# Patient Record
Sex: Female | Born: 1955 | Race: White | Hispanic: No
Health system: Western US, Academic
[De-identification: ages and names within clinical notes are randomized; demographics above are authoritative.]

## PROBLEM LIST (undated history)

## (undated) HISTORY — PX: PR UNLISTED PROCEDURE BREAST: 19499

## (undated) HISTORY — PX: PR TOTAL ABDOMINAL HYSTERECT W/WO RMVL TUBE OVARY: 58150

---

## 1998-08-16 ENCOUNTER — Other Ambulatory Visit: Admission: RE | Admit: 1998-08-16 | Discharge: 1998-08-16 | Payer: Self-pay | Admitting: *Deleted

## 2000-07-03 ENCOUNTER — Other Ambulatory Visit: Admission: RE | Admit: 2000-07-03 | Discharge: 2000-07-03 | Payer: Self-pay | Admitting: *Deleted

## 2004-03-28 ENCOUNTER — Encounter: Payer: Self-pay | Admitting: Internal Medicine

## 2004-06-21 ENCOUNTER — Encounter: Payer: PRIVATE HEALTH INSURANCE | Admitting: Obstetrics & Gynecology

## 2004-07-09 ENCOUNTER — Other Ambulatory Visit (HOSPITAL_BASED_OUTPATIENT_CLINIC_OR_DEPARTMENT_OTHER): Payer: Self-pay | Admitting: Obstetrics & Gynecology

## 2004-07-09 ENCOUNTER — Ambulatory Visit: Payer: PRIVATE HEALTH INSURANCE

## 2004-07-09 ENCOUNTER — Encounter (HOSPITAL_COMMUNITY): Payer: PRIVATE HEALTH INSURANCE | Admitting: Obstetrics & Gynecology

## 2004-07-23 ENCOUNTER — Encounter: Payer: PRIVATE HEALTH INSURANCE | Admitting: Nurse Practitioner

## 2004-08-26 ENCOUNTER — Encounter: Payer: PRIVATE HEALTH INSURANCE | Admitting: Obstetrics & Gynecology

## 2004-10-02 ENCOUNTER — Ambulatory Visit: Payer: Self-pay | Admitting: Orthopaedic Surgery

## 2005-03-06 ENCOUNTER — Ambulatory Visit: Payer: Self-pay | Admitting: Internal Medicine

## 2005-03-11 ENCOUNTER — Ambulatory Visit: Payer: Self-pay | Admitting: Internal Medicine

## 2005-06-11 ENCOUNTER — Ambulatory Visit: Payer: Self-pay | Admitting: Podiatry

## 2005-06-16 ENCOUNTER — Ambulatory Visit: Payer: Self-pay | Admitting: Unknown Physician Specialty

## 2005-06-16 ENCOUNTER — Other Ambulatory Visit: Payer: Self-pay

## 2005-06-19 ENCOUNTER — Ambulatory Visit: Payer: Self-pay | Admitting: Unknown Physician Specialty

## 2005-06-24 ENCOUNTER — Emergency Department: Payer: Self-pay | Admitting: Emergency Medicine

## 2005-10-10 ENCOUNTER — Ambulatory Visit: Payer: Self-pay

## 2005-10-29 ENCOUNTER — Ambulatory Visit: Payer: Self-pay | Admitting: General Practice

## 2006-02-04 ENCOUNTER — Ambulatory Visit: Payer: Self-pay | Admitting: Gynecologic Oncology

## 2006-02-16 ENCOUNTER — Ambulatory Visit: Payer: Self-pay | Admitting: Unknown Physician Specialty

## 2006-03-04 ENCOUNTER — Ambulatory Visit: Payer: Self-pay | Admitting: Gynecologic Oncology

## 2006-03-09 ENCOUNTER — Other Ambulatory Visit: Payer: Self-pay

## 2006-03-09 ENCOUNTER — Ambulatory Visit: Payer: Self-pay | Admitting: Unknown Physician Specialty

## 2006-03-18 ENCOUNTER — Ambulatory Visit: Payer: Self-pay | Admitting: Unknown Physician Specialty

## 2006-04-15 ENCOUNTER — Ambulatory Visit: Payer: Self-pay | Admitting: Gynecologic Oncology

## 2006-06-19 ENCOUNTER — Ambulatory Visit: Payer: Self-pay | Admitting: General Surgery

## 2007-03-16 ENCOUNTER — Ambulatory Visit: Payer: Self-pay | Admitting: Internal Medicine

## 2007-06-08 ENCOUNTER — Ambulatory Visit: Payer: Self-pay | Admitting: Gynecologic Oncology

## 2007-06-19 ENCOUNTER — Ambulatory Visit: Payer: Self-pay | Admitting: Gynecologic Oncology

## 2007-10-17 ENCOUNTER — Ambulatory Visit: Payer: Self-pay | Admitting: Gynecologic Oncology

## 2007-11-23 ENCOUNTER — Ambulatory Visit: Payer: Self-pay | Admitting: Gynecologic Oncology

## 2008-02-15 ENCOUNTER — Ambulatory Visit: Payer: Self-pay | Admitting: Internal Medicine

## 2008-02-17 ENCOUNTER — Ambulatory Visit: Payer: Self-pay | Admitting: Internal Medicine

## 2008-04-04 ENCOUNTER — Ambulatory Visit: Payer: Self-pay | Admitting: Internal Medicine

## 2008-06-08 ENCOUNTER — Ambulatory Visit: Payer: Self-pay | Admitting: Neurology

## 2008-11-03 ENCOUNTER — Ambulatory Visit: Payer: Self-pay | Admitting: Internal Medicine

## 2009-05-21 ENCOUNTER — Ambulatory Visit: Payer: Self-pay | Admitting: Internal Medicine

## 2009-07-10 ENCOUNTER — Ambulatory Visit: Payer: Self-pay | Admitting: Internal Medicine

## 2009-07-27 ENCOUNTER — Ambulatory Visit: Payer: Self-pay | Admitting: Internal Medicine

## 2010-02-04 ENCOUNTER — Ambulatory Visit (HOSPITAL_BASED_OUTPATIENT_CLINIC_OR_DEPARTMENT_OTHER): Admit: 2010-02-04 | Discharge: 2010-02-04 | Disposition: A | Discharge status: Home / Self Care | Payer: Self-pay

## 2010-03-07 ENCOUNTER — Ambulatory Visit: Payer: No Typology Code available for payment source | Attending: Surgical Oncology | Admitting: Surgical Oncology

## 2010-03-07 ENCOUNTER — Ambulatory Visit (HOSPITAL_BASED_OUTPATIENT_CLINIC_OR_DEPARTMENT_OTHER): Payer: No Typology Code available for payment source

## 2010-03-07 DIAGNOSIS — Z923 Personal history of irradiation: Secondary | ICD-10-CM | POA: Insufficient documentation

## 2010-03-07 DIAGNOSIS — Z1501 Genetic susceptibility to malignant neoplasm of breast: Secondary | ICD-10-CM | POA: Insufficient documentation

## 2010-03-07 DIAGNOSIS — Z853 Personal history of malignant neoplasm of breast: Secondary | ICD-10-CM | POA: Insufficient documentation

## 2010-03-07 DIAGNOSIS — Z803 Family history of malignant neoplasm of breast: Secondary | ICD-10-CM | POA: Insufficient documentation

## 2010-03-07 DIAGNOSIS — Z1502 Genetic susceptibility to malignant neoplasm of ovary: Secondary | ICD-10-CM | POA: Insufficient documentation

## 2010-03-19 ENCOUNTER — Ambulatory Visit
Payer: No Typology Code available for payment source | Attending: Plastic and Reconstructive Surgery | Admitting: Plastic and Reconstructive Surgery

## 2010-03-19 DIAGNOSIS — Z803 Family history of malignant neoplasm of breast: Secondary | ICD-10-CM | POA: Insufficient documentation

## 2010-03-19 DIAGNOSIS — Z853 Personal history of malignant neoplasm of breast: Secondary | ICD-10-CM | POA: Insufficient documentation

## 2010-04-07 ENCOUNTER — Other Ambulatory Visit: Payer: Self-pay

## 2010-04-14 ENCOUNTER — Other Ambulatory Visit: Payer: Self-pay

## 2010-06-19 ENCOUNTER — Other Ambulatory Visit: Payer: Self-pay

## 2010-07-17 ENCOUNTER — Other Ambulatory Visit: Payer: Self-pay

## 2010-07-22 ENCOUNTER — Ambulatory Visit: Payer: Self-pay | Admitting: Internal Medicine

## 2010-08-12 ENCOUNTER — Other Ambulatory Visit: Payer: Self-pay

## 2010-08-13 ENCOUNTER — Other Ambulatory Visit: Payer: Self-pay

## 2010-08-14 ENCOUNTER — Other Ambulatory Visit: Payer: Self-pay

## 2010-08-15 ENCOUNTER — Other Ambulatory Visit: Payer: Self-pay

## 2010-08-16 ENCOUNTER — Other Ambulatory Visit: Payer: Self-pay

## 2010-08-17 ENCOUNTER — Other Ambulatory Visit: Payer: Self-pay

## 2010-08-18 ENCOUNTER — Other Ambulatory Visit: Payer: Self-pay

## 2010-08-19 ENCOUNTER — Other Ambulatory Visit: Payer: Self-pay

## 2010-08-20 ENCOUNTER — Other Ambulatory Visit: Payer: Self-pay

## 2010-08-21 ENCOUNTER — Other Ambulatory Visit: Payer: Self-pay

## 2010-08-22 ENCOUNTER — Other Ambulatory Visit: Payer: Self-pay

## 2010-08-22 ENCOUNTER — Ambulatory Visit (HOSPITAL_BASED_OUTPATIENT_CLINIC_OR_DEPARTMENT_OTHER): Payer: No Typology Code available for payment source

## 2010-08-22 ENCOUNTER — Ambulatory Visit: Payer: No Typology Code available for payment source | Attending: Surgical Oncology | Admitting: Surgical Oncology

## 2010-08-22 DIAGNOSIS — Z803 Family history of malignant neoplasm of breast: Secondary | ICD-10-CM | POA: Insufficient documentation

## 2010-08-22 DIAGNOSIS — Z1502 Genetic susceptibility to malignant neoplasm of ovary: Secondary | ICD-10-CM | POA: Insufficient documentation

## 2010-08-22 DIAGNOSIS — Z853 Personal history of malignant neoplasm of breast: Secondary | ICD-10-CM | POA: Insufficient documentation

## 2010-08-22 DIAGNOSIS — Z4001 Encounter for prophylactic removal of breast: Secondary | ICD-10-CM | POA: Insufficient documentation

## 2010-08-22 DIAGNOSIS — Z1501 Genetic susceptibility to malignant neoplasm of breast: Secondary | ICD-10-CM | POA: Insufficient documentation

## 2010-08-24 ENCOUNTER — Other Ambulatory Visit: Payer: Self-pay

## 2010-08-25 ENCOUNTER — Other Ambulatory Visit: Payer: Self-pay

## 2010-08-27 ENCOUNTER — Ambulatory Visit: Payer: No Typology Code available for payment source | Attending: Plastic and Reconstructive Surgery

## 2010-08-27 ENCOUNTER — Ambulatory Visit (HOSPITAL_BASED_OUTPATIENT_CLINIC_OR_DEPARTMENT_OTHER): Payer: No Typology Code available for payment source

## 2010-08-27 ENCOUNTER — Other Ambulatory Visit: Payer: Self-pay

## 2010-08-27 ENCOUNTER — Other Ambulatory Visit: Payer: Self-pay | Admitting: Plastic and Reconstructive Surgery

## 2010-08-27 DIAGNOSIS — Z923 Personal history of irradiation: Secondary | ICD-10-CM | POA: Insufficient documentation

## 2010-08-27 DIAGNOSIS — C50919 Malignant neoplasm of unspecified site of unspecified female breast: Secondary | ICD-10-CM | POA: Insufficient documentation

## 2010-08-27 DIAGNOSIS — Z9221 Personal history of antineoplastic chemotherapy: Secondary | ICD-10-CM | POA: Insufficient documentation

## 2010-08-27 DIAGNOSIS — Z9010 Acquired absence of unspecified breast and nipple: Secondary | ICD-10-CM | POA: Insufficient documentation

## 2010-08-27 DIAGNOSIS — Z90710 Acquired absence of both cervix and uterus: Secondary | ICD-10-CM | POA: Insufficient documentation

## 2010-08-27 DIAGNOSIS — Z01818 Encounter for other preprocedural examination: Secondary | ICD-10-CM | POA: Insufficient documentation

## 2010-08-27 DIAGNOSIS — J984 Other disorders of lung: Secondary | ICD-10-CM | POA: Insufficient documentation

## 2010-08-27 DIAGNOSIS — Z853 Personal history of malignant neoplasm of breast: Secondary | ICD-10-CM | POA: Insufficient documentation

## 2010-08-27 DIAGNOSIS — Z1501 Genetic susceptibility to malignant neoplasm of breast: Secondary | ICD-10-CM | POA: Insufficient documentation

## 2010-08-28 ENCOUNTER — Other Ambulatory Visit: Payer: Self-pay

## 2010-08-29 ENCOUNTER — Other Ambulatory Visit: Payer: Self-pay

## 2010-08-29 LAB — PR CTA ANGIO ABD AORTA COMBO

## 2010-08-30 ENCOUNTER — Other Ambulatory Visit: Payer: Self-pay

## 2010-08-31 ENCOUNTER — Other Ambulatory Visit: Payer: Self-pay

## 2010-09-01 ENCOUNTER — Other Ambulatory Visit: Payer: Self-pay

## 2010-09-02 ENCOUNTER — Other Ambulatory Visit: Payer: Self-pay

## 2010-09-03 ENCOUNTER — Other Ambulatory Visit: Payer: Self-pay

## 2010-09-04 ENCOUNTER — Other Ambulatory Visit: Payer: Self-pay

## 2010-09-04 ENCOUNTER — Encounter (HOSPITAL_BASED_OUTPATIENT_CLINIC_OR_DEPARTMENT_OTHER): Payer: No Typology Code available for payment source

## 2010-09-05 ENCOUNTER — Other Ambulatory Visit: Payer: Self-pay

## 2010-09-06 ENCOUNTER — Other Ambulatory Visit: Payer: Self-pay

## 2010-09-07 ENCOUNTER — Other Ambulatory Visit: Payer: Self-pay

## 2010-09-11 ENCOUNTER — Inpatient Hospital Stay (HOSPITAL_COMMUNITY): Payer: No Typology Code available for payment source | Admitting: Plastic and Reconstructive Surgery

## 2010-09-11 ENCOUNTER — Inpatient Hospital Stay
Admission: RE | Admit: 2010-09-11 | Discharge: 2010-09-16 | DRG: 261 | Disposition: A | Discharge status: Home / Self Care | Payer: No Typology Code available for payment source | Attending: Plastic and Reconstructive Surgery | Admitting: Plastic and Reconstructive Surgery

## 2010-09-11 ENCOUNTER — Other Ambulatory Visit (HOSPITAL_BASED_OUTPATIENT_CLINIC_OR_DEPARTMENT_OTHER): Payer: Self-pay | Admitting: Surgical Oncology

## 2010-09-11 DIAGNOSIS — Z853 Personal history of malignant neoplasm of breast: Secondary | ICD-10-CM

## 2010-09-11 DIAGNOSIS — Z803 Family history of malignant neoplasm of breast: Secondary | ICD-10-CM

## 2010-09-11 DIAGNOSIS — Z4001 Encounter for prophylactic removal of breast: Principal | ICD-10-CM

## 2010-09-11 DIAGNOSIS — D62 Acute posthemorrhagic anemia: Secondary | ICD-10-CM | POA: Diagnosis not present

## 2010-09-11 DIAGNOSIS — Z1501 Genetic susceptibility to malignant neoplasm of breast: Secondary | ICD-10-CM

## 2010-09-11 DIAGNOSIS — Z923 Personal history of irradiation: Secondary | ICD-10-CM

## 2010-09-19 ENCOUNTER — Ambulatory Visit: Payer: No Typology Code available for payment source | Attending: Surgical Oncology | Admitting: Surgical Oncology

## 2010-09-19 DIAGNOSIS — Z853 Personal history of malignant neoplasm of breast: Secondary | ICD-10-CM | POA: Insufficient documentation

## 2010-09-19 DIAGNOSIS — Z09 Encounter for follow-up examination after completed treatment for conditions other than malignant neoplasm: Secondary | ICD-10-CM | POA: Insufficient documentation

## 2010-09-19 DIAGNOSIS — K5900 Constipation, unspecified: Secondary | ICD-10-CM | POA: Insufficient documentation

## 2010-09-19 DIAGNOSIS — Z1501 Genetic susceptibility to malignant neoplasm of breast: Secondary | ICD-10-CM | POA: Insufficient documentation

## 2010-09-19 DIAGNOSIS — R110 Nausea: Secondary | ICD-10-CM | POA: Insufficient documentation

## 2010-09-19 DIAGNOSIS — Z803 Family history of malignant neoplasm of breast: Secondary | ICD-10-CM | POA: Insufficient documentation

## 2010-09-24 ENCOUNTER — Ambulatory Visit
Payer: No Typology Code available for payment source | Attending: Plastic and Reconstructive Surgery | Admitting: Plastic and Reconstructive Surgery

## 2010-09-24 DIAGNOSIS — Z421 Encounter for breast reconstruction following mastectomy: Secondary | ICD-10-CM | POA: Insufficient documentation

## 2010-09-24 DIAGNOSIS — Z853 Personal history of malignant neoplasm of breast: Secondary | ICD-10-CM

## 2010-09-24 DIAGNOSIS — Z9010 Acquired absence of unspecified breast and nipple: Secondary | ICD-10-CM

## 2010-09-24 DIAGNOSIS — Z978 Presence of other specified devices: Secondary | ICD-10-CM | POA: Insufficient documentation

## 2010-09-26 ENCOUNTER — Ambulatory Visit: Payer: No Typology Code available for payment source | Attending: Plastic and Reconstructive Surgery

## 2010-09-26 DIAGNOSIS — C50919 Malignant neoplasm of unspecified site of unspecified female breast: Secondary | ICD-10-CM | POA: Insufficient documentation

## 2010-10-02 ENCOUNTER — Ambulatory Visit: Payer: No Typology Code available for payment source | Attending: Plastic and Reconstructive Surgery

## 2010-10-02 DIAGNOSIS — Z09 Encounter for follow-up examination after completed treatment for conditions other than malignant neoplasm: Secondary | ICD-10-CM | POA: Insufficient documentation

## 2010-10-03 ENCOUNTER — Ambulatory Visit (HOSPITAL_BASED_OUTPATIENT_CLINIC_OR_DEPARTMENT_OTHER): Payer: No Typology Code available for payment source | Admitting: Plastic and Reconstructive Surgery

## 2010-10-03 ENCOUNTER — Other Ambulatory Visit (HOSPITAL_BASED_OUTPATIENT_CLINIC_OR_DEPARTMENT_OTHER): Payer: Self-pay | Admitting: Plastic and Reconstructive Surgery

## 2010-10-03 ENCOUNTER — Ambulatory Visit
Admission: RE | Admit: 2010-10-03 | Discharge: 2010-10-03 | Disposition: A | Discharge status: Home / Self Care | Payer: No Typology Code available for payment source | Attending: Plastic and Reconstructive Surgery | Admitting: Plastic and Reconstructive Surgery

## 2010-10-03 DIAGNOSIS — IMO0002 Reserved for concepts with insufficient information to code with codable children: Secondary | ICD-10-CM | POA: Insufficient documentation

## 2010-10-03 DIAGNOSIS — T85898A Other specified complication of other internal prosthetic devices, implants and grafts, initial encounter: Secondary | ICD-10-CM | POA: Insufficient documentation

## 2010-10-03 DIAGNOSIS — Z9010 Acquired absence of unspecified breast and nipple: Secondary | ICD-10-CM | POA: Insufficient documentation

## 2010-10-03 DIAGNOSIS — T85698A Other mechanical complication of other specified internal prosthetic devices, implants and grafts, initial encounter: Secondary | ICD-10-CM | POA: Insufficient documentation

## 2010-10-03 DIAGNOSIS — Z853 Personal history of malignant neoplasm of breast: Secondary | ICD-10-CM | POA: Insufficient documentation

## 2010-10-03 DIAGNOSIS — N6489 Other specified disorders of breast: Secondary | ICD-10-CM | POA: Insufficient documentation

## 2010-10-09 LAB — PATHOLOGY, SURGICAL

## 2010-10-11 ENCOUNTER — Other Ambulatory Visit (HOSPITAL_BASED_OUTPATIENT_CLINIC_OR_DEPARTMENT_OTHER): Payer: Self-pay | Admitting: Physician Assistant

## 2010-10-11 ENCOUNTER — Ambulatory Visit: Payer: No Typology Code available for payment source | Attending: Physician Assistant

## 2010-10-11 DIAGNOSIS — T85898A Other specified complication of other internal prosthetic devices, implants and grafts, initial encounter: Secondary | ICD-10-CM | POA: Insufficient documentation

## 2010-10-15 LAB — WOUND C/S W/GRAM (ANAEROBIC): Gram Smear: NONE SEEN

## 2010-10-18 ENCOUNTER — Ambulatory Visit: Payer: No Typology Code available for payment source | Attending: Physician Assistant

## 2010-10-18 DIAGNOSIS — B965 Pseudomonas (aeruginosa) (mallei) (pseudomallei) as the cause of diseases classified elsewhere: Secondary | ICD-10-CM | POA: Insufficient documentation

## 2010-10-18 DIAGNOSIS — Z9010 Acquired absence of unspecified breast and nipple: Secondary | ICD-10-CM | POA: Insufficient documentation

## 2010-10-18 DIAGNOSIS — T8140XA Infection following a procedure, unspecified, initial encounter: Secondary | ICD-10-CM | POA: Insufficient documentation

## 2010-10-22 ENCOUNTER — Encounter (HOSPITAL_BASED_OUTPATIENT_CLINIC_OR_DEPARTMENT_OTHER): Payer: No Typology Code available for payment source | Admitting: Plastic and Reconstructive Surgery

## 2010-11-12 ENCOUNTER — Encounter (HOSPITAL_BASED_OUTPATIENT_CLINIC_OR_DEPARTMENT_OTHER): Payer: No Typology Code available for payment source | Admitting: Plastic and Reconstructive Surgery

## 2010-11-12 ENCOUNTER — Ambulatory Visit
Payer: No Typology Code available for payment source | Attending: Plastic and Reconstructive Surgery | Admitting: Plastic and Reconstructive Surgery

## 2010-11-12 DIAGNOSIS — Z853 Personal history of malignant neoplasm of breast: Secondary | ICD-10-CM | POA: Insufficient documentation

## 2010-11-12 DIAGNOSIS — Z9010 Acquired absence of unspecified breast and nipple: Secondary | ICD-10-CM | POA: Insufficient documentation

## 2010-11-12 DIAGNOSIS — T8131XA Disruption of external operation (surgical) wound, not elsewhere classified, initial encounter: Secondary | ICD-10-CM | POA: Insufficient documentation

## 2010-12-24 ENCOUNTER — Encounter (HOSPITAL_BASED_OUTPATIENT_CLINIC_OR_DEPARTMENT_OTHER): Payer: No Typology Code available for payment source | Admitting: Plastic and Reconstructive Surgery

## 2011-05-29 ENCOUNTER — Ambulatory Visit: Payer: No Typology Code available for payment source | Attending: Surgical Oncology | Admitting: Surgical Oncology

## 2011-05-29 DIAGNOSIS — Z853 Personal history of malignant neoplasm of breast: Secondary | ICD-10-CM | POA: Insufficient documentation

## 2011-05-29 DIAGNOSIS — Z803 Family history of malignant neoplasm of breast: Secondary | ICD-10-CM | POA: Insufficient documentation

## 2011-05-29 DIAGNOSIS — Z1501 Genetic susceptibility to malignant neoplasm of breast: Secondary | ICD-10-CM | POA: Insufficient documentation

## 2011-05-29 DIAGNOSIS — B358 Other dermatophytoses: Secondary | ICD-10-CM | POA: Insufficient documentation

## 2011-07-24 ENCOUNTER — Ambulatory Visit: Payer: Self-pay | Admitting: Gynecologic Oncology

## 2011-07-29 ENCOUNTER — Ambulatory Visit: Payer: Self-pay | Admitting: Internal Medicine

## 2011-08-26 ENCOUNTER — Ambulatory Visit: Payer: Self-pay | Admitting: Gynecologic Oncology

## 2011-10-08 ENCOUNTER — Ambulatory Visit: Payer: Self-pay | Admitting: Neurology

## 2011-11-25 ENCOUNTER — Ambulatory Visit
Payer: No Typology Code available for payment source | Attending: Plastic and Reconstructive Surgery | Admitting: Plastic and Reconstructive Surgery

## 2011-11-25 DIAGNOSIS — Z853 Personal history of malignant neoplasm of breast: Secondary | ICD-10-CM | POA: Insufficient documentation

## 2011-11-25 DIAGNOSIS — Z9010 Acquired absence of unspecified breast and nipple: Secondary | ICD-10-CM | POA: Insufficient documentation

## 2012-02-06 ENCOUNTER — Ambulatory Visit: Payer: No Typology Code available for payment source | Attending: Physician Assistant

## 2012-02-06 DIAGNOSIS — Z853 Personal history of malignant neoplasm of breast: Secondary | ICD-10-CM | POA: Insufficient documentation

## 2012-02-06 DIAGNOSIS — Z9010 Acquired absence of unspecified breast and nipple: Secondary | ICD-10-CM | POA: Insufficient documentation

## 2012-02-20 ENCOUNTER — Ambulatory Visit: Payer: No Typology Code available for payment source | Attending: Plastic and Reconstructive Surgery

## 2012-02-20 ENCOUNTER — Other Ambulatory Visit (HOSPITAL_BASED_OUTPATIENT_CLINIC_OR_DEPARTMENT_OTHER): Payer: Self-pay | Admitting: Plastic and Reconstructive Surgery

## 2012-02-20 DIAGNOSIS — N651 Disproportion of reconstructed breast: Secondary | ICD-10-CM | POA: Insufficient documentation

## 2012-02-20 DIAGNOSIS — L988 Other specified disorders of the skin and subcutaneous tissue: Secondary | ICD-10-CM | POA: Insufficient documentation

## 2012-02-20 DIAGNOSIS — Z1501 Genetic susceptibility to malignant neoplasm of breast: Secondary | ICD-10-CM | POA: Insufficient documentation

## 2012-02-20 DIAGNOSIS — N650 Deformity of reconstructed breast: Secondary | ICD-10-CM | POA: Insufficient documentation

## 2012-02-20 DIAGNOSIS — Z853 Personal history of malignant neoplasm of breast: Secondary | ICD-10-CM | POA: Insufficient documentation

## 2012-02-20 DIAGNOSIS — Z6834 Body mass index (BMI) 34.0-34.9, adult: Secondary | ICD-10-CM | POA: Insufficient documentation

## 2012-02-20 DIAGNOSIS — E669 Obesity, unspecified: Secondary | ICD-10-CM | POA: Insufficient documentation

## 2012-02-25 LAB — PATHOLOGY, SURGICAL

## 2012-03-02 ENCOUNTER — Ambulatory Visit
Payer: No Typology Code available for payment source | Attending: Plastic and Reconstructive Surgery | Admitting: Plastic and Reconstructive Surgery

## 2012-03-02 DIAGNOSIS — Z853 Personal history of malignant neoplasm of breast: Secondary | ICD-10-CM | POA: Insufficient documentation

## 2012-03-02 DIAGNOSIS — Z9010 Acquired absence of unspecified breast and nipple: Secondary | ICD-10-CM | POA: Insufficient documentation

## 2012-10-21 ENCOUNTER — Ambulatory Visit: Payer: Self-pay | Admitting: Obstetrics and Gynecology

## 2012-10-21 LAB — BASIC METABOLIC PANEL
Anion Gap: 5 — ABNORMAL LOW (ref 7–16)
BUN: 8 mg/dL (ref 7–18)
Calcium, Total: 8.1 mg/dL — ABNORMAL LOW (ref 8.5–10.1)
Co2: 25 mmol/L (ref 21–32)
EGFR (African American): >60
EGFR (Non-African Amer.): >60
Osmolality: 275 (ref 275–301)
Potassium: 4 mmol/L (ref 3.5–5.1)
Sodium: 139 mmol/L (ref 136–145)

## 2012-10-21 LAB — CBC
HGB: 10.5 g/dL — ABNORMAL LOW (ref 12.0–16.0)
MCH: 26.3 pg (ref 26.0–34.0)
MCV: 83 fL (ref 80–100)
RBC: 3.98 10*6/uL (ref 3.80–5.20)
RDW: 18.6 % — ABNORMAL HIGH (ref 11.5–14.5)
WBC: 8.6 10*3/uL (ref 3.6–11.0)

## 2012-10-25 ENCOUNTER — Ambulatory Visit: Payer: Self-pay | Admitting: Obstetrics and Gynecology

## 2013-01-06 ENCOUNTER — Other Ambulatory Visit: Payer: Self-pay

## 2013-01-28 ENCOUNTER — Emergency Department: Payer: Self-pay | Admitting: Emergency Medicine

## 2013-01-28 LAB — COMPREHENSIVE METABOLIC PANEL
Alkaline Phosphatase: 122 U/L (ref 50–136)
Anion Gap: 10 (ref 7–16)
Bilirubin,Total: 0.2 mg/dL (ref 0.2–1.0)
Calcium, Total: 8.5 mg/dL (ref 8.5–10.1)
Chloride: 109 mmol/L — ABNORMAL HIGH (ref 98–107)
Creatinine: 1.12 mg/dL (ref 0.60–1.30)
Glucose: 82 mg/dL (ref 65–99)
Osmolality: 280 (ref 275–301)
Potassium: 4 mmol/L (ref 3.5–5.1)
SGOT(AST): 23 U/L (ref 15–37)
SGPT (ALT): 26 U/L (ref 12–78)
Total Protein: 7.5 g/dL (ref 6.4–8.2)

## 2013-01-28 LAB — URINALYSIS, COMPLETE
Bilirubin,UR: NEGATIVE
Glucose,UR: NEGATIVE mg/dL (ref 0–75)
Ketone: NEGATIVE
Nitrite: NEGATIVE
Ph: 5 (ref 4.5–8.0)
Protein: NEGATIVE
RBC,UR: <1 /HPF (ref 0–5)
Specific Gravity: 1.028 (ref 1.003–1.030)
Squamous Epithelial: 2

## 2013-01-28 LAB — CBC
MCV: 78 fL — ABNORMAL LOW (ref 80–100)
Platelet: 543 10*3/uL — ABNORMAL HIGH (ref 150–440)
RBC: 4.47 10*6/uL (ref 3.80–5.20)
WBC: 8.4 10*3/uL (ref 3.6–11.0)

## 2013-01-28 LAB — LIPASE, BLOOD: Lipase: 167 U/L (ref 73–393)

## 2013-03-29 ENCOUNTER — Ambulatory Visit: Payer: Self-pay | Admitting: Internal Medicine

## 2014-12-08 NOTE — Op Note (Signed)
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  OPERATIVE PROCEDURE: Wide local excision of the left labia majora.   SURGEON: Herold HarmsMartin A Revia Nghiem, MD.   FIRST ASSISTANT:  Kipp BroodJaime Seal, [employer] PA student.   ANESTHESIA: General.   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 A 6 x 3 cm verrucous lesion was seen preoperatively and is to be excised.   DESCRIPTION OF PROCEDURE: The patient was brought to the operating room where she was placed in the supine position. General anesthesia was induced without difficulty. She was placed in the dorsal lithotomy position using candy cane stirrups. The patient was prepped in the standard fashion. Acetic acid was placed onto the vulva in order to help outline a potential VIN lesions. After soaking the vulva for several minutes, the acetic acid soaked 4 x 4'Koch were removed. There was no obvious acetowhite epithelium seen. There the left labia majora verrucous type epithelium noted that is persisting. This area was outlined with a marking pen. Using Bovie cautery the specimen was then excised using the needle tip point. Once the specimen was resected with dimensions being 6 cm x 3 cm, hemostasis was obtained with point-tip Bovie cautery. The epithelial edges were then reapproximated using simple interrupted sutures of 3-0 Vicryl. Good hemostasis was obtained. The surgical site was then cleaned and dried. The patient was then awakened, mobilized, and taken to the recovery room in satisfactory condition.   ESTIMATED BLOOD LOSS: Minimal.   COMPLICATIONS:  None.    All instruments, needle and sponge counts were verified as correct.   ____________________________ Prentice DockerMartin A.  Courtenay Creger, MD mad:ct D: 10/25/2012 17:15:16 ET T: 10/26/2012 09:15:54 ET JOB#: [id number]  cc: Daphine DeutscherMartin A. Tyshawna Alarid, MD, <Dictator> [facility] Prentice DockerMARTIN A Shaletha Humble MD ELECTRONICALLY SIGNED 10/31/2012 17:39

## 2014-12-22 ENCOUNTER — Ambulatory Visit
Payer: No Typology Code available for payment source | Attending: Plastic and Reconstructive Surgery | Admitting: Plastic and Reconstructive Surgery

## 2014-12-22 ENCOUNTER — Encounter (HOSPITAL_BASED_OUTPATIENT_CLINIC_OR_DEPARTMENT_OTHER): Payer: Self-pay | Admitting: Plastic and Reconstructive Surgery

## 2014-12-22 VITALS — BP 127/85 | HR 79 | Temp 97.2°F | Ht 63.0 in | Wt 189.4 lb

## 2014-12-22 DIAGNOSIS — Z9010 Acquired absence of unspecified breast and nipple: Secondary | ICD-10-CM | POA: Insufficient documentation

## 2014-12-22 DIAGNOSIS — Z9013 Acquired absence of bilateral breasts and nipples: Secondary | ICD-10-CM | POA: Insufficient documentation

## 2014-12-22 DIAGNOSIS — Z853 Personal history of malignant neoplasm of breast: Secondary | ICD-10-CM | POA: Insufficient documentation

## 2014-12-22 NOTE — Patient Instructions (Signed)
Thank you for your visit today to the [facility].     To contact clinic:    To speak with the schedulers or clinic staff (MA/ RN), phone [phone] and select option 8 after the initial automated message. If you have a question requiring medical advice, the front desk staff will take your message and a Plastic Surgery RN or MA will return your call.  Phone calls will be returned based on urgency, generally within 24 hours for all calls.   For urgent concerns after-hours, phone [phone] to speak to the Plastic Surgery Resident on Call.

## 2014-12-22 NOTE — Progress Notes (Signed)
PLASTIC SURGERY FOLLOW-UP NOTE    Reason for visit: Follow-up  Amber Fletcher is a 59 year old patient post B DIEP breast reconstruction after mastectomy    Interval History:   She is doing well. Notes a blue suture protruding from the R breast recon    Physical Exam:  BP 127/85 mmHg  Pulse 79  Temp(Src) 97.2 F (36.2 C) (Temporal)  Ht 5\' 3"  (1.6 m)  Wt 189 lb 6.4 oz (85.911 kg)  BMI 33.56 kg/m2  SpO2 98%  Incision c/d/i  No signs of infection  Blue suture protruding from the R breast, trimmed and extracted, using 18 gauge needle. Flaps healthy and viable. Good balance of shape and volume. Abdominal donor site doing well.       Assessment / Plan  Doing well   No complaints once the blue suture was removed. Very pleased with the long-term outcome of her B DIEP. Good to see her again.       Onica Davidovich K. Michela Pitcher, MD [facility]   Attending Physician, Associate Professor, Division of Plastic Surgery   Director, Microvascular Reconstructive Surgery Fellowship   [facility] [place]   [place], [place], [place], saw and examined the patient and formulated the management plan. I agree with the accompanying note.  Briefly, we had an extensive discussion regarding the surgical options as detailed in the assessment and plan of the accompanying note.  I spent more than 25 minutes of face-to-face time with the patient, over half of which was patient education and counseling.    Kenyonna Micek K. Michela Pitcher, MD

## 2015-01-06 IMAGING — CR DG CHEST 2V
1 series · 2 of 2 positions shown · non-contrast
Comparison: none

REASON FOR EXAM: htn
COMMENTS:

PROCEDURE:     DXR - DXR CHEST PA (OR AP) AND LATERAL  - October 21, 2012  [DATE]
RESULT:     The lungs are clear. The heart and pulmonary vessels are normal.
The bony and mediastinal structures are unremarkable. There is no effusion.
There is no pneumothorax or evidence of congestive failure.

[Series 1: w chest pa · 0.14mm/px · 2 of 2 slices shown]
[im 1/2]
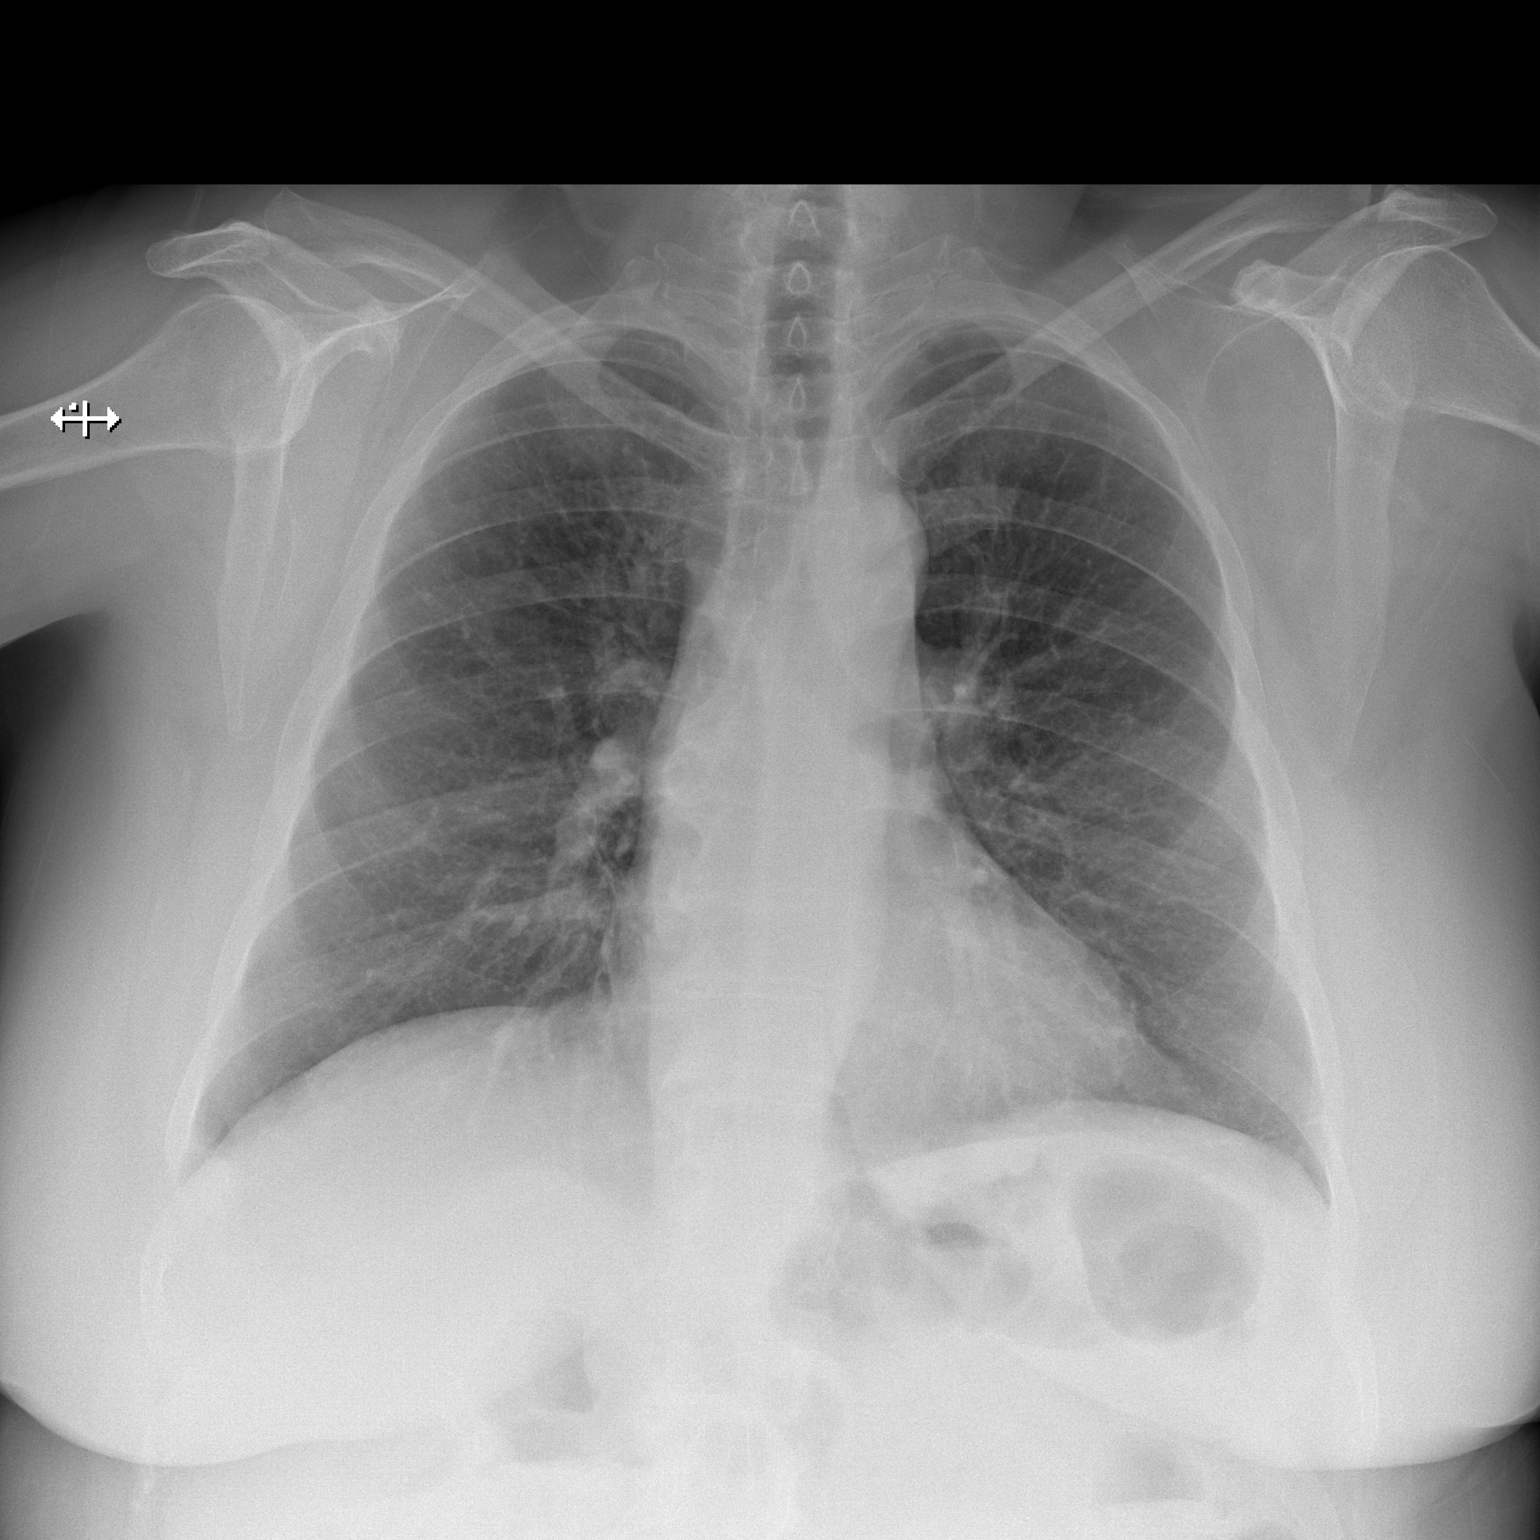
[im 2/2]
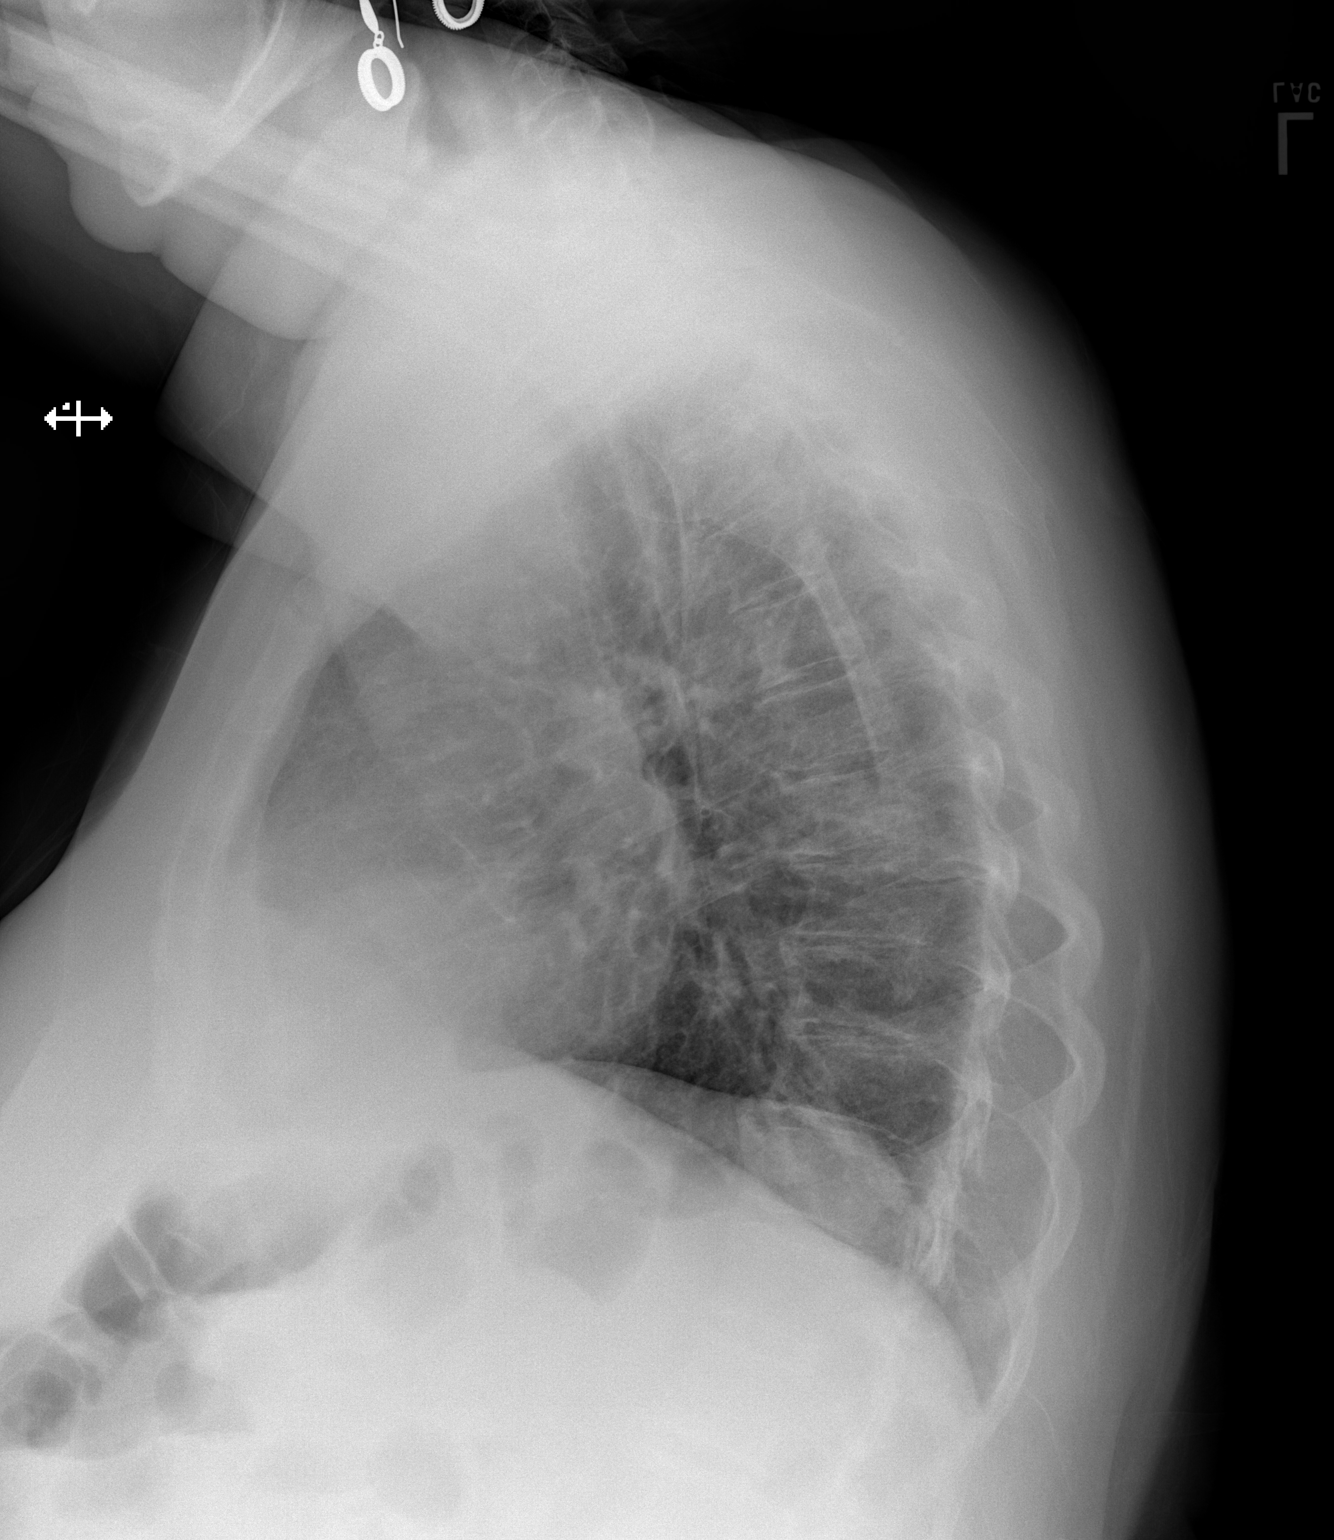

[2 of 2 positions shown; findings below may reference images not displayed]

IMPRESSION: No acute cardiopulmonary disease.

[REDACTED]

## 2015-04-15 IMAGING — CT CT HEAD WITHOUT CONTRAST
1 series · 16 of 30 positions shown, 20 images · non-contrast
Comparison: none

REASON FOR EXAM: pain
COMMENTS:

PROCEDURE:     CT  - CT HEAD WITHOUT CONTRAST  - January 28, 2013  [DATE]
RESULT:     Comparison:  None
TECHNIQUE: Multiple axial images from the foramen magnum to the vertex were
obtained without IV contrast.

[Series 2: soft tissue · axial · 0.46mm/px · z∈[-124,+10]mm · 16 of 30 slices shown, 20 images]
[im 2/30  brain]
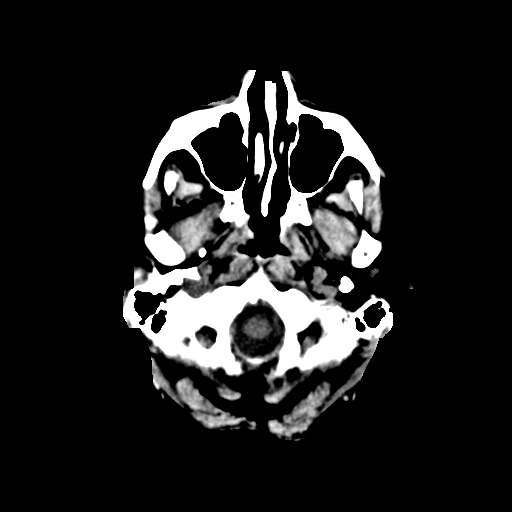
[im 2/30  bone]
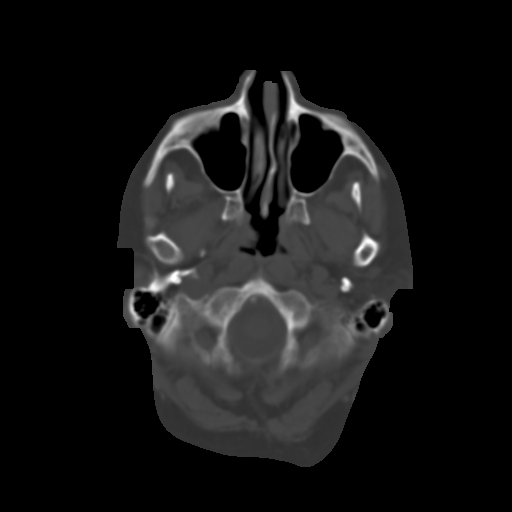
[im 4/30  brain]
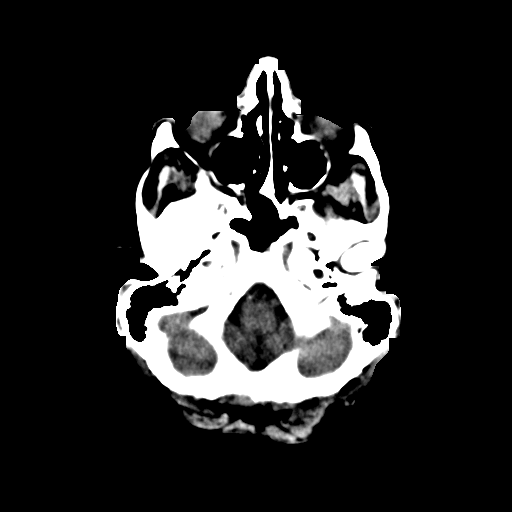
[im 6/30  brain]
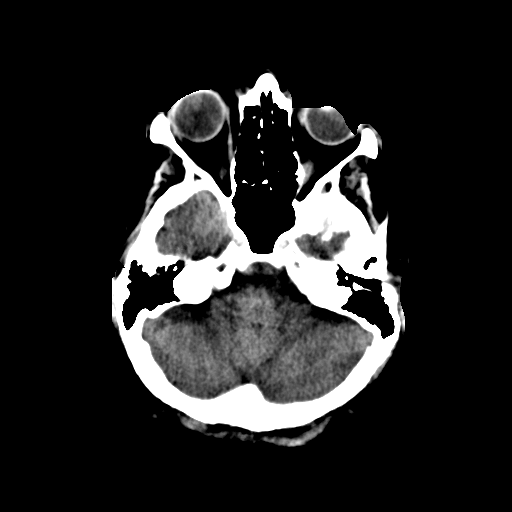
[im 8/30  brain]
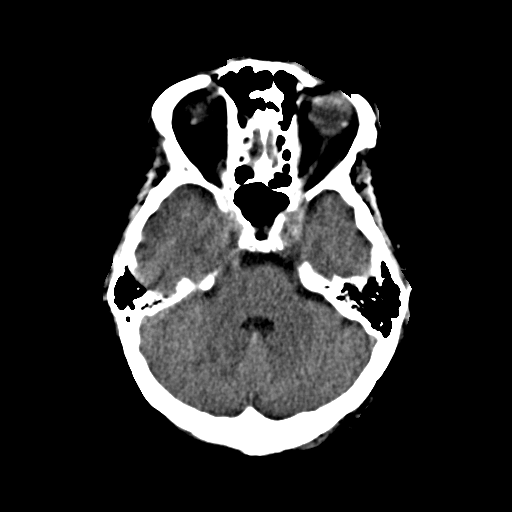
[im 9/30  brain]
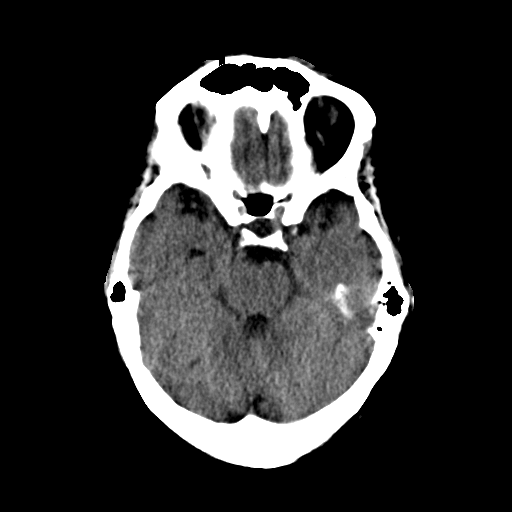
[im 9/30  bone]
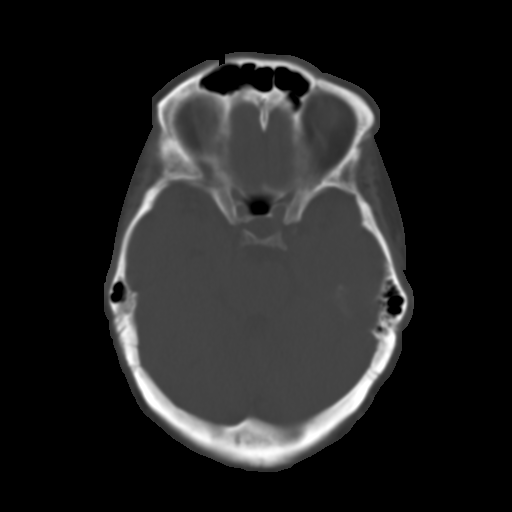
[im 11/30  brain]
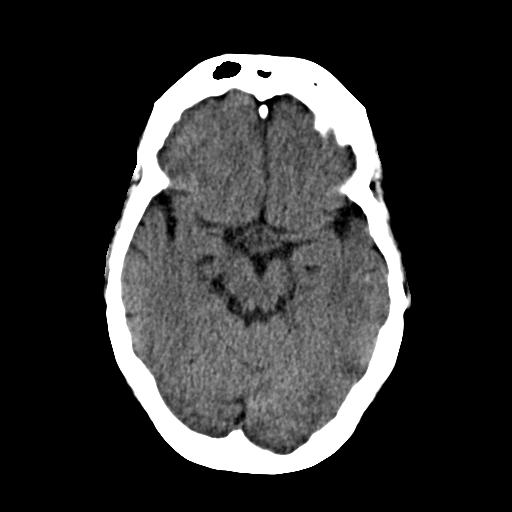
[im 13/30  brain]
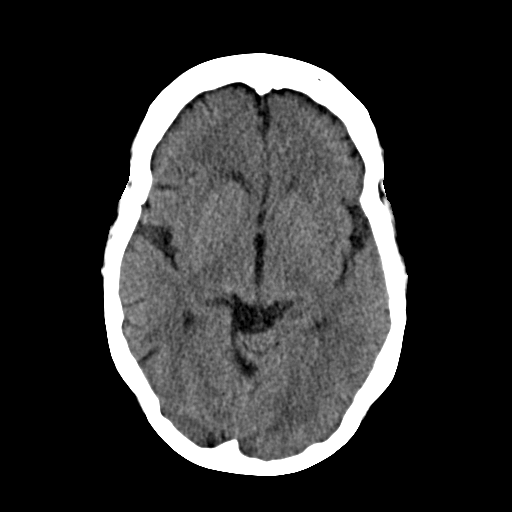
[im 15/30  brain]
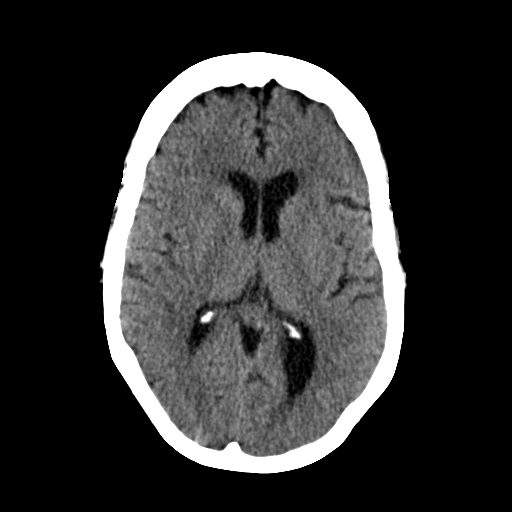
[im 16/30  brain]
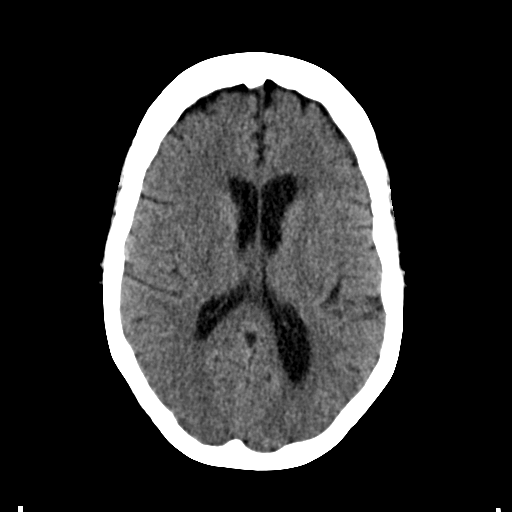
[im 16/30  bone]
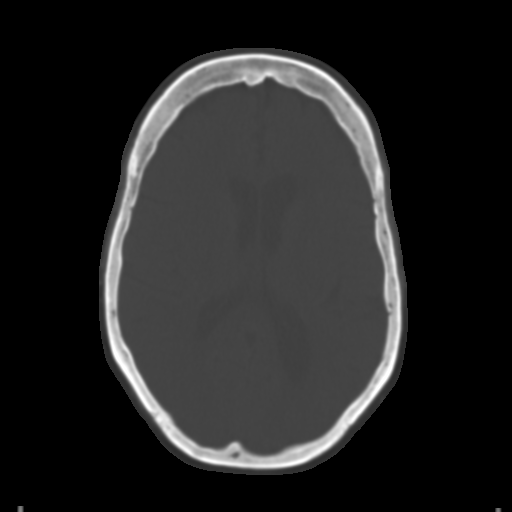
[im 18/30  brain]
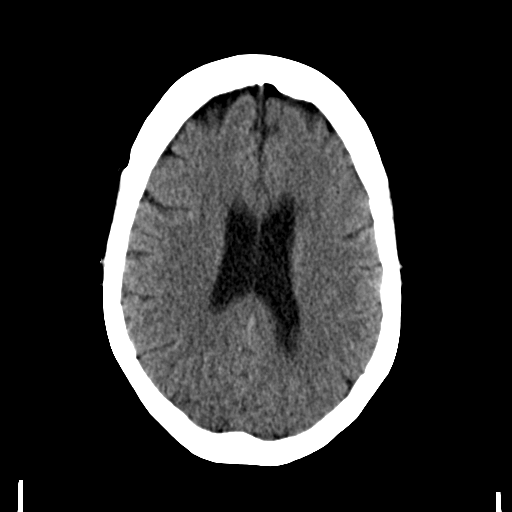
[im 20/30  brain]
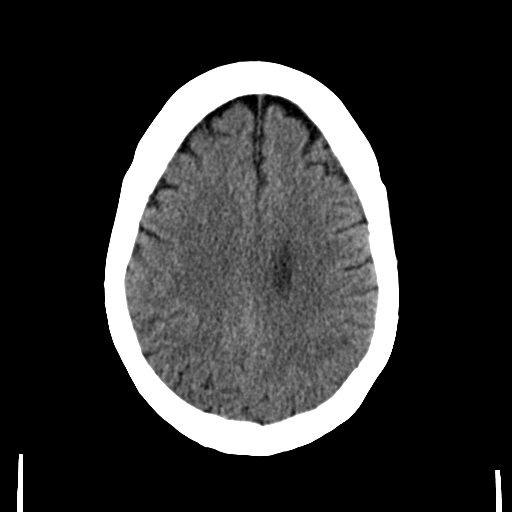
[im 22/30  brain]
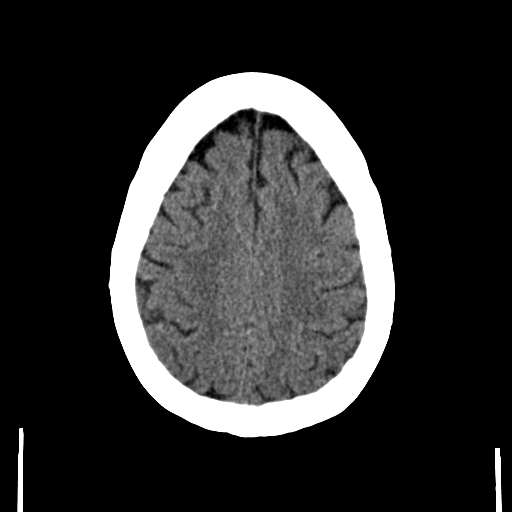
[im 23/30  brain]
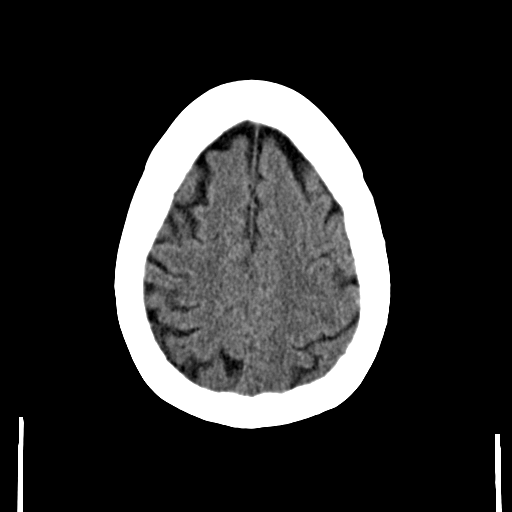
[im 23/30  bone]
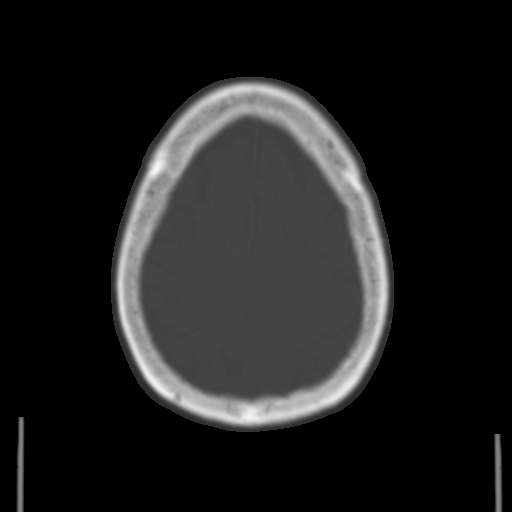
[im 25/30  brain]
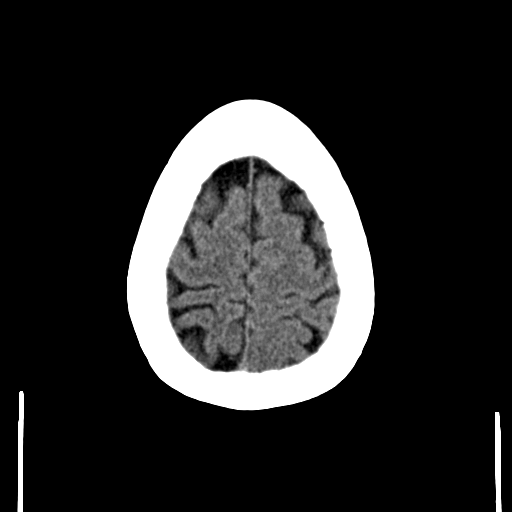
[im 27/30  brain]
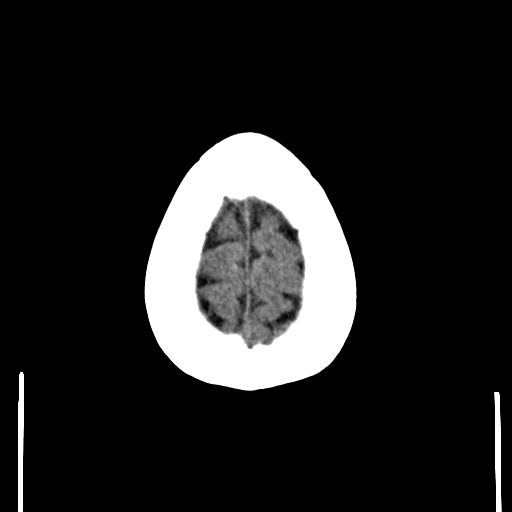
[im 29/30  brain]
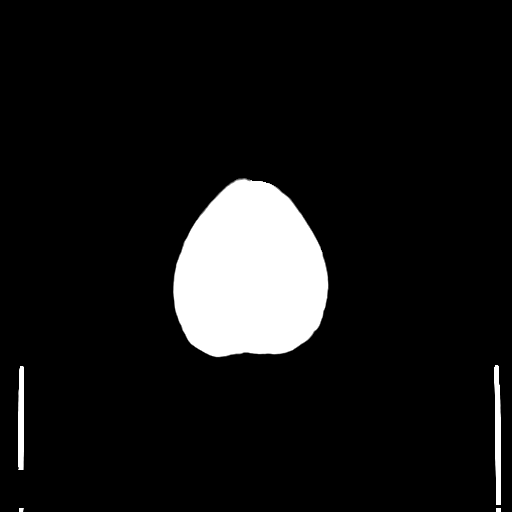

[16 of 30 positions shown; findings below may reference images not displayed]

FINDINGS: There is no evidence of mass effect, midline shift, or extra-axial fluid
collections.  There is no evidence of a space-occupying lesion or
intracranial hemorrhage. There is no evidence of a cortical-based area of
acute infarction.

The ventricles and sulci are appropriate for the patient's age. The basal
cisterns are patent.

Visualized portions of the orbits are unremarkable. The visualized portions
of the paranasal sinuses and mastoid air cells are unremarkable.

The osseous structures are unremarkable.
IMPRESSION: No acute intracranial process.

[REDACTED]

## 2016-05-05 ENCOUNTER — Other Ambulatory Visit (HOSPITAL_BASED_OUTPATIENT_CLINIC_OR_DEPARTMENT_OTHER): Payer: Self-pay

## 2019-08-17 ENCOUNTER — Other Ambulatory Visit: Payer: Self-pay

## 2019-08-22 ENCOUNTER — Other Ambulatory Visit: Payer: Self-pay

## 2019-08-26 ENCOUNTER — Encounter (INDEPENDENT_AMBULATORY_CARE_PROVIDER_SITE_OTHER): Payer: Self-pay | Admitting: Urology

## 2019-08-28 ENCOUNTER — Other Ambulatory Visit: Payer: Self-pay

## 2019-08-29 ENCOUNTER — Encounter (INDEPENDENT_AMBULATORY_CARE_PROVIDER_SITE_OTHER): Payer: Self-pay | Admitting: Urology

## 2019-08-29 ENCOUNTER — Telehealth (INDEPENDENT_AMBULATORY_CARE_PROVIDER_SITE_OTHER): Payer: No Typology Code available for payment source | Admitting: Urology

## 2019-08-29 DIAGNOSIS — Z853 Personal history of malignant neoplasm of breast: Secondary | ICD-10-CM

## 2019-08-29 DIAGNOSIS — N2889 Other specified disorders of kidney and ureter: Secondary | ICD-10-CM

## 2019-08-29 DIAGNOSIS — Z9013 Acquired absence of bilateral breasts and nipples: Secondary | ICD-10-CM

## 2019-08-29 DIAGNOSIS — Z1501 Genetic susceptibility to malignant neoplasm of breast: Secondary | ICD-10-CM

## 2019-08-29 NOTE — Progress Notes (Signed)
I, Eloy End, saw and evaluated this patient with Dr. Desiree Hane, MD Resident. I have reviewed the findings and management plan that we developed together, as he has documented. I (1) personally performed the patient's physical exam which was deferred for telehealth visit,  reviewed all pertinent (2) laboratory studies, which demonstrate serum creatinine 0.71 mg/dL with GFR 90 mL/min/1.73 m and radiologic studies, which was her CT scan from August 22, 2019 which revealed a 2.7 cm right renal mass (I measured 2.5 cm by my review). I provided additional counseling concerning the patient's condition and treatment plan. (3) Specifically, we discussed the options for incidentally detected small: observation, renal mass biopsy, or proceeding directly to treatment with either surgical excision or ablative approach.  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 I will call her with the pathology result. Plan 4 week follow up telehealth visit.

## 2019-08-29 NOTE — Progress Notes (Signed)
UROLOGY OUTPATIENT CONSULTATION:     HISTORY OF PRESENT ILLNESS  Amber Fletcher is a 64 year old female referred to me by Amber Fletcher for further evaluation and management of a newly identified right renal mass. The renal mass was diagnosed upon workup for GI symptoms. She initially underwent an ultrasound which was concerning for a pancreatic cyst/mass, which prompted a CT  Pancreas protocol which identified an enhancing right mid-pole mass measuring approximately 2.7cm in greatest dimension. Given these findings, she was referred to our clinic today.     No family history of renal cancer. However, notably, she has known BRCA mutation and has undergone bilateral mastectomy for breast cancer. Both of her daughters are BRCA carriers. And she has two uncles with history of prostate cancer.     She has not had a chest imaging yet. She has had appropriate lab work.     Cr 0.71, GFR 90 on 08/08/19      Past Medical Hx:    Patient Active Problem List   Diagnosis   . History of breast cancer   . Acquired absence of breast and absent nipple   Osteoporosis     Past Surgical Hx:    Bilateral mastectomy with DIEP flap reconstruction  TAH/BSO (prophylactic in setting BRCA)    Family and Social Hx:    No family history of renal cancers  Two uncles with prostate cancer  Two daughters BRCA +, no diagnosed malignancy  One son BRCA -  Mother BRCA +, no history of cancer  Sister BRCA+, breast cancer    Social History     Social History Narrative   . Not on file   Lives in [place], [place]  Married. Two daughters    Active Meds:    Tums  Famotidine  Lactobacillus  Multivitamin  Omeprazole     Allergies:    Allergies as of 08/29/2019 - Reviewed 12/22/2014   Allergen Reaction Noted   . Oxycodone Hives and Rash 12/22/2014       Review of Systems:   A 14 system review of system was completed and is negative other than what is reported in the HPI above    OBJECTIVE:   Vitals and physical exam not performed as this was a telemedicine encounter during the [place] pandemic    Labs Reviewed today with the patient include:  Results for orders placed or performed in visit on 10/11/10   CULTURE:BACT - WOUND (AERO & ANAER)   Result Value Ref Range    Specimen Description Wound   Left   Breast     Special Requests Includes Aerobic screen   Patient on antibiotics     Gram Smear 2+ Polymorphonuclear cells     Gram Smear No organisms seen     Culture       2+ Pseudomonas aeruginosa colony type No.1 This   bacterial species is known to produce a chromosomal   AmpC  inducible beta-lactamase. Penicillin or   cephalosporin monotherapy for serious infections may   result in the emergence of high-level resistance.    Culture 2+ Pseudomonas aeruginosa colony type No.2     Culture       Staphylococcus, coagulase negative isolated from broth   only    Culture       Comment:  See link for important guidelines regarding   the interpretation of susceptibility reports:   http://tests.labmed.http://www.walker-hill.info/    Report Status Final 10/15/2010        Susceptibility  2+ pseudomonas aeruginosa colony type no.1 this  bacterial species is known to produce a chromosomal  ampc  inducible beta-lactamase. penicillin or  cephalosporin monotherapy for serious infections ma -  (no method available)     Amikacin <=16 Susceptible      Cefepime 8 Susceptible (with comment)      Ceftazidime 2 Susceptible      Gentamicin <=4 Susceptible      Imipenem 2 Susceptible      Levofloxacin 1 Susceptible (with comment)      Meropenem 2 Susceptible      Piperacillin/tazobactam <=8 Susceptible      Ticarcillin/clavulanic acid 32 Intermediate.      Tobramycin <=2 Susceptible     2+ pseudomonas aeruginosa colony type no.2 (microtiter mic - select) -  (no method available)     Amikacin <=16 Susceptible      Cefepime 2 Susceptible (with comment)      Ceftazidime 1 Susceptible      Ciprofloxacin 0.12 Susceptible       Gentamicin <=4 Susceptible      Imipenem 1 Susceptible      Levofloxacin <=0.5 Susceptible      Meropenem <=1 Susceptible      Piperacillin/tazobactam <=8 Susceptible      Ticarcillin/clavulanic acid 32 Intermediate.      Tobramycin <=2 Susceptible            ASSESSMENT/PLAN:   Amber Fletcher is a 64 year old female with PMH notable of BRCA+ and breast cancer s/p remote bilateral mastectomy and prophylactic TAH-BSO,  with recently identified right small renal mass measuring approximately 2.7cm as well as a small adrenal mass consistent with adenoma based on imaging features.      Regarding, the right renal mass, I had a long discussion with Amber Fletcher regarding the nature of solid renal tumors including the high likelihood that this represents a renal cell carcinoma based on size. Uniquely, with her history of breast cancer, there is also a chance this renal mass could represent a breast cancer metastasis. Given that her breast cancer treatment was a long time ago with no recent concern for recurrence, this is unlikely but remains a possibility. Presuming that this represents a renal cell carcinoma we discussed the staging of kidney cancer and our impression that-if this is a cancer-this renal mass would be a clinical stage T1a renal cell carcinoma. We discussed options for management of this renal mass including active surveillance and intervention with either ablation or extirpation. We also discussed the option of renal mass biopsy and the rationale for renal mass biopsy. The risks and benefits to these management options were carefully reviewed, and we particularly focused our discussion in the context of her breast cancer history. All these options are reasonable and safe, however Amber Fletcher would like to proceed with renal mass biopsy with the goal of differentiating benign vs. Malignant, and if malignant, whether or not it is related to breast cancer or a renal cancer. This is very reasonable and we support this plan.     We had a lengthy discussion about renal mass biopsies, the process, the anatomic approach, risks/benefits, and the possibility of having an inconclusive result. She expressed understanding and we will proceed with arranging for renal mass biopsy.     The left adrenal mass is benign in appearance and most consistent with adenoma. We discussed possible need for a metabolic evaluation to determine if this is metabolically active, however we will revisit this after further  evaluation of her renal mass.     We will also arrange for a CT chest w/o contrast to complete staging.     We will follow-up in approximately 1 month to discuss biopsy results and determine next steps.     - IR consult for renal mass biopsy  - CT Chest w/o contrast  - Defer adrenal mass workup for now, will revisit at next appointment  - F/u 1 month, telemdicine        Marciano Sequin, MD  PGY5 Urology Resident  [facility] [place]

## 2019-08-30 ENCOUNTER — Telehealth (INDEPENDENT_AMBULATORY_CARE_PROVIDER_SITE_OTHER): Payer: Self-pay | Admitting: Urology

## 2019-08-30 NOTE — Telephone Encounter (Signed)
Patient called back in regards to scheduling a 4week telemedicine follow up appointment with Dr. Deliah Boston to review Biospy results. Patient has been scheduled for their follow up on 09/26/19 at 10:40AM.    Patient wanted to confirm with the RN if the patient should be reaching out to IR to schedule their biopsy procedure or if they will be contacting the patient to schedule. Patient can be called back to confirm at [phone].

## 2019-08-30 NOTE — Telephone Encounter (Signed)
Called and LVM for pt in regards to scheduling a 4 week follow-up with Dr.Dash. Provided the [facility] phone number for a return call.

## 2019-08-30 NOTE — Patient Instructions (Addendum)
1. We will obtain a biopsy of the right renal mass.  2. I will call you with the pathology result.  3. Make tentative plan for 4 week follow up to review by telehealth.

## 2019-08-31 NOTE — Telephone Encounter (Signed)
Dear Team,    Please let patient know that IR should call her. I sent her eCare message.    Thanks. AD

## 2019-09-01 NOTE — Telephone Encounter (Signed)
Called pt with phone number to IR and sent order to [place]

## 2019-09-06 ENCOUNTER — Telehealth (INDEPENDENT_AMBULATORY_CARE_PROVIDER_SITE_OTHER): Payer: Self-pay | Admitting: Urology

## 2019-09-06 NOTE — Telephone Encounter (Signed)
[facility] Caryl Pina) called in regards to patient's CT Chest w/o Contrast order that Dr. Deliah Boston had ordered.    Caryl Pina is requesting patient's most recent chart notes with Dr. Deliah Boston to be faxed over to [phone] to start prior authorization for patient's CT. Patient is currently scheduled for their CT next week for them.    The [facility] front desk staff has faxed out the to the most recent chart notes to the aforementioned fax # as of 09/06/19.

## 2019-09-08 ENCOUNTER — Encounter (HOSPITAL_BASED_OUTPATIENT_CLINIC_OR_DEPARTMENT_OTHER): Payer: Self-pay | Admitting: Urology

## 2019-09-08 DIAGNOSIS — N2889 Other specified disorders of kidney and ureter: Secondary | ICD-10-CM

## 2019-09-09 ENCOUNTER — Other Ambulatory Visit (HOSPITAL_BASED_OUTPATIENT_CLINIC_OR_DEPARTMENT_OTHER): Payer: Self-pay

## 2019-09-09 DIAGNOSIS — Z01812 Encounter for preprocedural laboratory examination: Secondary | ICD-10-CM

## 2019-09-10 ENCOUNTER — Other Ambulatory Visit (HOSPITAL_BASED_OUTPATIENT_CLINIC_OR_DEPARTMENT_OTHER): Payer: Self-pay

## 2019-09-10 DIAGNOSIS — Z01812 Encounter for preprocedural laboratory examination: Secondary | ICD-10-CM

## 2019-09-13 ENCOUNTER — Other Ambulatory Visit: Payer: Self-pay

## 2019-09-13 ENCOUNTER — Other Ambulatory Visit (HOSPITAL_BASED_OUTPATIENT_CLINIC_OR_DEPARTMENT_OTHER): Payer: Self-pay

## 2019-09-13 DIAGNOSIS — Z01818 Encounter for other preprocedural examination: Secondary | ICD-10-CM

## 2019-09-15 ENCOUNTER — Other Ambulatory Visit (HOSPITAL_BASED_OUTPATIENT_CLINIC_OR_DEPARTMENT_OTHER): Payer: Self-pay

## 2019-09-15 ENCOUNTER — Ambulatory Visit
Admission: RE | Admit: 2019-09-15 | Discharge: 2019-09-15 | Disposition: A | Discharge status: Home / Self Care | Payer: No Typology Code available for payment source | Attending: Diagnostic Radiology | Admitting: Diagnostic Radiology

## 2019-09-15 ENCOUNTER — Ambulatory Visit (HOSPITAL_BASED_OUTPATIENT_CLINIC_OR_DEPARTMENT_OTHER): Payer: No Typology Code available for payment source | Admitting: Radiology Med

## 2019-09-15 ENCOUNTER — Other Ambulatory Visit (HOSPITAL_BASED_OUTPATIENT_CLINIC_OR_DEPARTMENT_OTHER): Payer: Self-pay | Admitting: Diagnostic Radiology

## 2019-09-15 ENCOUNTER — Ambulatory Visit (HOSPITAL_COMMUNITY): Payer: Self-pay | Admitting: Diagnostic Radiology

## 2019-09-15 ENCOUNTER — Ambulatory Visit (HOSPITAL_BASED_OUTPATIENT_CLINIC_OR_DEPARTMENT_OTHER): Payer: No Typology Code available for payment source

## 2019-09-15 DIAGNOSIS — N2889 Other specified disorders of kidney and ureter: Secondary | ICD-10-CM | POA: Insufficient documentation

## 2019-09-15 DIAGNOSIS — C641 Malignant neoplasm of right kidney, except renal pelvis: Secondary | ICD-10-CM

## 2019-09-15 DIAGNOSIS — Z01812 Encounter for preprocedural laboratory examination: Secondary | ICD-10-CM | POA: Insufficient documentation

## 2019-09-15 DIAGNOSIS — Z9889 Other specified postprocedural states: Secondary | ICD-10-CM | POA: Insufficient documentation

## 2019-09-15 LAB — PROTHROMBIN & PTT
Partial Thromboplastin Time: 31 s (ref 22–35)
Prothrombin INR: 1 (ref 0.8–1.3)
Prothrombin Time Patient: 12.6 s (ref 10.7–15.6)

## 2019-09-15 LAB — PLATELET COUNT: Platelet Count: 271 10*3/uL (ref 150–400)

## 2019-09-16 LAB — PATHOLOGY, SURGICAL

## 2019-09-19 ENCOUNTER — Telehealth (INDEPENDENT_AMBULATORY_CARE_PROVIDER_SITE_OTHER): Payer: Self-pay | Admitting: Urology

## 2019-09-19 ENCOUNTER — Other Ambulatory Visit: Payer: Self-pay

## 2019-09-19 NOTE — Telephone Encounter (Signed)
I called patient and left message.  I called her to see how she is doing after biopsy renal mass and review the pathology.  Left message for her to call me back.  I sent a eCare message.

## 2019-09-21 ENCOUNTER — Other Ambulatory Visit (INDEPENDENT_AMBULATORY_CARE_PROVIDER_SITE_OTHER): Payer: Self-pay | Admitting: Urology

## 2019-09-21 DIAGNOSIS — N2889 Other specified disorders of kidney and ureter: Secondary | ICD-10-CM

## 2019-09-21 NOTE — Telephone Encounter (Signed)
I sent eCare message to patient that report of CT chest negative.

## 2019-09-21 NOTE — Telephone Encounter (Signed)
I sent eCare message. Do you guys remember where CT chest was done? Can you track down report and images?

## 2019-09-23 ENCOUNTER — Encounter (INDEPENDENT_AMBULATORY_CARE_PROVIDER_SITE_OTHER): Payer: Self-pay | Admitting: Urology

## 2019-09-24 ENCOUNTER — Other Ambulatory Visit: Payer: Self-pay

## 2019-09-26 ENCOUNTER — Telehealth (INDEPENDENT_AMBULATORY_CARE_PROVIDER_SITE_OTHER): Payer: No Typology Code available for payment source | Admitting: Urology

## 2019-09-26 DIAGNOSIS — C641 Malignant neoplasm of right kidney, except renal pelvis: Secondary | ICD-10-CM

## 2019-09-26 NOTE — Patient Instructions (Signed)
1.  Let us work on surgery scheduling.  Max will coordinate.  2.  I will check with your plastic surgeon regarding any issues they might be concerned about from your harvest site.  3.  I will discuss her case with the general surgeons whether they consider gallbladder surgery at the same time as our surgery and or if they would like to see you sooner for consultation.

## 2019-09-26 NOTE — Progress Notes (Signed)
UROLOGY TELEHEALTH NOTE    ID/CC:    Right renal cell carcinoma    History of Present Illness:    Amber Fletcher is a 64 year old woman who returns  to me for right renal cell carcinoma.  Patient was found to have an incidental small right renal mass and her follow-up imaging for breast cancer in patient with BRCA2 mutation.  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  She underwent staging CT scan of her chest on September 19, 2019 that was negative for metastatic disease.  She has a history of bilateral mastectomy with bilateral breast reconstruction.  No other abdominal surgery.  She did require revision of the breast reconstruction.  She does have some ongoing right upper quadrant discomfort which I do not believe related to her renal cell carcinoma.  She had a previous ultrasound indicating small gallstones but her August 22, 2019 CT does not show any stones.    Review of Systems:  A 14 system review of system was filled out by the patient.  Positives include feeling well overall. The rest of the review of system is negative and can be linked to in the chart dated today.    Past Medical Hx:    Patient Active Problem List   Diagnosis   . History of breast cancer   . Acquired absence of breast and absent nipple       Past Surgical Hx:    Past Surgical History:   Procedure Laterality Date   . TOTAL ABDOMINAL HYSTERECT W/WO RMVL TUBE OVARY      BRCA 2 prophylaxis       Social Hx:      reports that she has never smoked. She does not have any smokeless tobacco history on file..    Social History     Social History Narrative   . Not on file       Active Meds:    No outpatient medications have been marked as taking for the 09/26/19 encounter (Appointment) with Eloy End, MD.       OBJECTIVE:  Vital Signs:  There were no vitals taken for this visit.  Constitutional: No acute distress  No exam for telehealth visit.      Review of Medical Images, tracings or  specimens:    Labs Reviewed today with the patient include  Orders Only on 09/15/2019   Component Date Value Ref Range Status   . Demographics 09/15/2019    Final                    Value:** DEMOGRAPHICS DRAWN FROM PATHOLOGY REPORT **  PATIENT:   Amber Fletcher  MRN:       [id number]     [facility])  DOB:       1955/11/16  SEX:       F  .  CASE:   [id number]   COLLECTED:  Sep 15 2019    RECEIVED: Sep 15 2019     . 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       _______________________________________________________  _________________________________________    CLINICAL DATA:  History  of breast cancer.      GROSS DESCRIPTION:  A) Received in formalin labeled as "Amber Fletcher"   per requisition designated as "right renal mass" is a   0.5 x 0.2 x 0.1 cm aggregate of tan-pink soft tissue.    Entirely submitted in A1.  Total fixation time   approximately 16-1/2 hours.  (KER/cmm)          Amber Cookey MD  Pathologist  Electronically signed 09/16/2019  In compliance with CMS regulations, the pathologist's   signature on this report indicates that the case has   been personally reviewed, and the diagnosis made or   confirmed by, the Attending Pathologist. Microscopic   examination was used to arrive at the diagnosis unless   in                          dicated otherwise. Testing Location: [facility], [address] [place], [place]   [address] Ph: [phone] Fax: [phone].           ASSESSMENT:  Amber Fletcher is a 64 year old female with biopsy confirmed right renal cell carcinoma after presenting with an incidental small right renal mass on routine surveillance imaging for breast cancer.  CT scan of the chest was negative.  She would like to proceed with  surgical treatment.    PLAN:    1.  We discussed the options and patient would like to proceed with surgical excision with partial nephrectomy.  We will start working on scheduling.  2.  We discussed the surgery, the postoperative course and expected recovery.  3.  We discussed her previous breast reconstruction and harvest site and how this may impact surgery.  In reviewing her chart in the previous operative notes, I do not see any obvious contraindications from performing an anterior transperitoneal robotic approach, but I will contact her plastic surgeon.  4.  I will also discuss her case with my general surgery colleagues due to her right upper quadrant discomfort and possible gallstones and whether they would consider performing simultaneous cholecystectomy.    Distant Site Telemedicine Encounter    I conducted this encounter from [facility] via secure, live, face-to-face video conference with the patient. Amber Fletcher was located at home with her husband.  Prior to the interview, the risks and benefits of telemedicine were discussed with the patient and verbal consent was obtained.    I spent a total time of 30 minutes face-to-face with the patient, of which more than 50% was spent counseling and coordinating care as outlined in this note.

## 2019-10-05 ENCOUNTER — Other Ambulatory Visit (INDEPENDENT_AMBULATORY_CARE_PROVIDER_SITE_OTHER): Payer: Self-pay | Admitting: Urology

## 2019-10-05 DIAGNOSIS — Z01818 Encounter for other preprocedural examination: Secondary | ICD-10-CM

## 2019-10-05 DIAGNOSIS — C641 Malignant neoplasm of right kidney, except renal pelvis: Secondary | ICD-10-CM

## 2019-10-05 NOTE — Addendum Note (Signed)
Addended by: Kathlene November on: 10/05/2019 12:59 PM     Modules accepted: Orders

## 2019-10-06 ENCOUNTER — Encounter (INDEPENDENT_AMBULATORY_CARE_PROVIDER_SITE_OTHER): Payer: Self-pay

## 2019-10-06 ENCOUNTER — Telehealth (INDEPENDENT_AMBULATORY_CARE_PROVIDER_SITE_OTHER): Payer: Self-pay | Admitting: Surgery

## 2019-10-06 NOTE — Telephone Encounter (Signed)
GENERAL SURGERY REFERRAL      What is the patient being referred for (diagnosis):   Gallstone    Who is the referring provider?   Eloy End, MD from [place] dated: 09/26/19, in [place]  - In-basket message received from Dr. Deliah Boston to Dr. Dema Severin regarding pt's care; referral manually entered    Name of PCP:   Vladimir Crofts, DO from [place] [place] dated: 08/22/19, in [place]     Have you seen any other provider here at [facility]? (if yes, schedule with that provider or ask preference)   No    Has the patient had any testing or imaging done related to this diagnosis? (if colorectal dx, any colonoscopies?)   CT pancreas - 08/22/19 at [facility], report in [place], image on PACS    [place] abd - 08/17/19 at [facility]. [facility], report in [place], image on PACS    Is/Has the patient seen any other specialists?   Urology - referring provider    Has the patient had any surgeries/operations? (list all surgeries)   Revision of bilateral breast reconstruction, revision of abd donor site - 02/20/12 at [facility] with Cindie Laroche, MD, report in [facility]    Evacuation of hematoma, left breast reconstruction, revision of left breast mastectomy skin - 10/03/10 at [facility] with Cindie Laroche, MD, report in [facility]    Bilateral skin-sparing total mastectomies, left and right immediate breast reconstruction, harvest of left superficial inferior epigastric vein for venous salvage, immediate reconstruction of bilateral breast with free deep inferior epigastric perforator flap... - 09/11/10 at [facility] with Cory Roughen, MD, Cindie Laroche, MD and Tawny Asal, MD report in [place]    Laparoscopic-assisted vaginal hysterectomy and bilateral salpingo-oophorectomy - 07/09/04 at [facility] with Vic Blackbird, MD, report in [place] Name/Plan:   [facility]    Were records requested?  No, in Epic   Date records requested:  N/A   Additional records info?  (ex: phone and fax records were requested from) N/A

## 2019-10-12 ENCOUNTER — Other Ambulatory Visit: Payer: Self-pay

## 2019-10-12 NOTE — Progress Notes (Signed)
Telehealth visit    I conducted this encounter from [facility] via secure, live, face-to-face video conference with the patient due to ongoing COVID-19 pandemic recommendations regarding social distancing. Amber Fletcher was located at home. Present with me was Amber Fletcher who acted as Presenter, broadcasting during the encounter.     Prior to the interview, the risks and benefits of telemedicine were discussed with the patient. Risks include equipment failure, poor image resolution, and information security issues.     All of the patients questions regarding telemedicine were discussed and answered. Patient voices understanding and gives verbal consent to proceed and knows there may be a copay/deductible: Yes      Patient provided government issued ID to verify identity and verified their name and birth:  Yes        Time spent with patient/guardian on this telephone visit: 15 minutes    10/13/2019    Referring Physician:  Eloy End, MD    Reason for Referral:     Chief Complaint   Patient presents with    Telemedicine       HPI:   Amber Fletcher is a 64 year old female who presented with symptomatic cholelithiasis. She reports burning in epigastrium radiates to RUQ for years. She occasionally experiencing" sharp, stabbing" pain usually after a meal. Currently takes 59m omeprazole.     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 The patient is scheduled for robotic right partial nephrectomy on 10/27/19 and Dr. DDeliah Bostonhas requested for a co-case surgery for cholecystectomy at the same time.    Of note, the patient has a h/o BRCA mutation and has had a bilateral mastectomy.  She had a laprascopic hysterectomy and bilateral salpnigo[facility] EVic Blackbird Bilateral skin-sparing total mastectomies, left and right immediate breast reconstruction, harvest of left superficial inferior epigastric vein for venous salvage, immediate reconstruction of bilateral breast  with free deep inferior epigastric perforator [facility] Dr. SMichela Pitcher and Dr. LWilliam Hamburger Revision of bilateral breast reconstruction, [facility] abd [facility]     I personally reviewed and confirmed the ROS, past medical history, past surgical history, family history and social history in the record with the patient today    Past History:    Past Medical History:   Diagnosis Date    BRCA2 positive 2004       Past Surgical History:   Procedure Laterality Date    TOTAL ABDOMINAL HYSTERECT W/WO RMVL TUBE OVARY      BRCA 2 prophylaxis    UNLISTED PROCEDURE BREAST Bilateral     mastectomy       Social History     Socioeconomic History    Marital status: Married     Spouse name: Not on file    Number of children: Not on file    Years of education: Not on file    Highest education level: Not on file   Occupational History    Not on file   Social Needs    Financial resource strain: Not on file    Food insecurity     Worry: Not on file     Inability: Not on file    Transportation needs     Medical: Not on file     Non-medical: Not on file   Tobacco Use    Smoking status: Never Smoker   Substance and Sexual Activity  Alcohol use: Not on file    Drug use: Not on file    Sexual activity: Not on file   Lifestyle    Physical activity     Days per week: Not on file     Minutes per session: Not on file    Stress: Not on file   Relationships    Social connections     Talks on phone: Not on file     Gets together: Not on file     Attends religious service: Not on file     Active member of club or organization: Not on file     Attends meetings of clubs or organizations: Not on file     Relationship status: Not on file    Intimate partner violence     Fear of current or ex partner: Not on file     Emotionally abused: Not on file     Physically abused: Not on file     Forced sexual activity: Not on file   Other Topics Concern    Not on file   Social  History Narrative    Not on file       Family History     Problem (# of Occurrences) Relation (Name,Age of Onset)    BRCA2 Positive  (2) Daughter, Daughter    Breast Cancer (1) Sister          Allergies:   Review of patient's allergies indicates:  Allergies   Allergen Reactions    Oxycodone Hives and Rash        Current Meds:       Current Outpatient Medications:     Ascorbic Acid (VITAMIN C OR), Take by mouth daily., Disp: , Rfl:     CALCIUM OR, Take by mouth daily., Disp: , Rfl:     Ergocalciferol (VITAMIN D OR), Take by mouth daily., Disp: , Rfl:     Multiple Vitamin (MULTIVITAMIN OR), Take by mouth daily., Disp: , Rfl:     omeprazole 40 MG DR capsule, Take 40 mg by mouth daily on an empty stomach. , Disp: , Rfl:     Review of Systems:  A full review of systems was completed and reviewed with patient; positives noted as per HPI otherwise negative.      PE-   There were no vitals taken for this visit.    General- No apparent distress   Abdomen- Well healed lower transverse incision and she points to tenderness in the RUQ.    Diagnostic studies:    ABDOMINAL ULTRASOUND 15-Sep-2020:    Clinical: RUQ pain. BRCA gene positive.    Comparison: None available    Procedure: Ultrasound of the abdomen       Results  Liver Length 15.1 cm  CBD Max 5.8 mm  Spleen Length 10.1 cm  Aorta Proximal 2.2 cm  Aorta Mid 1.6 cm  Aorta Distal 1.6 cm  Rt Kidney Length 9.8 cm  Lt Kidney Length 9.7 cm    General:  Liver echogenicity normal. No liver masses seen. The main portal vein is normal in  diameter. Multiple small gallstones. No wall thickening or pericholecystic fluid.  Negative sonographic Murphys sign. Extrahepatic bile duct normal. No intrahepatic  biliary dilatation. Probable pancreatic tall mass measuring 2.5 cm. Aorta and IVC  normal.Spleen is normal. Kidneys normal. No hydronephrosis.No ascites.      Conclusions:  1. Cholelithiasis without evidence of cholecystitis or biliary obstruction.  2. Hypoechoic 2.5 cm  possible mass or  complicated cyst at the tail of the pancreas,  differential diagnosis including prominent sized splenule. Recommend dedicated  pancreatic CT if there are no prior CT examinations for comparison.      CT CHEST WO CONTRAST 09/19/19:  1. no evidence of metastatic disease within the chest.  2. right renal mass as seen on prior CT most consistent with a renal cell carcinoma  3. stable left adrenal mass, likely an adenoma    Assessment and Plan:  Amber Fletcher is a 64 year old female who presented with symptomatic cholelithiasis.    We had a discussion regarding the anatomy and pathophysiology of gallstones.  We discussed proceeding with robotic cholecystectomy with cholangiogram at the same time as her robotic right partial nephrectomy on 10/27/19 with Dr. Deliah Boston. We discussed preoperative, intraoperative, and post operative  Details in depth including risks, benefits, and alternatives. Risks including but not limited to injection, bleeding, damage to any organ in the area. Risks unique to gallbladder surgery includes 1 in 10,000 risk of injury to the main bile duct. There is also a 2% chance of post-operative diarrhea.    All of the patients questions and concerns were answered. The patient voiced understanding and agreement with this plan.    Maryella Shivers Dema Severin, MD, FACS  General Surgery, Robotics, Advanced Laparoscopy, and   Esophageal Surgery   [facility]      Portions of this note were documented by [facility], medical scribe, on 10/13/2019 with oversight by Dr. Dema Severin

## 2019-10-13 ENCOUNTER — Encounter (INDEPENDENT_AMBULATORY_CARE_PROVIDER_SITE_OTHER): Payer: Self-pay | Admitting: Surgery

## 2019-10-13 ENCOUNTER — Telehealth (INDEPENDENT_AMBULATORY_CARE_PROVIDER_SITE_OTHER): Payer: No Typology Code available for payment source | Admitting: Surgery

## 2019-10-13 DIAGNOSIS — K8020 Calculus of gallbladder without cholecystitis without obstruction: Secondary | ICD-10-CM

## 2019-10-13 DIAGNOSIS — K8050 Calculus of bile duct without cholangitis or cholecystitis without obstruction: Secondary | ICD-10-CM

## 2019-10-13 NOTE — Progress Notes (Signed)
I, Renay Crammer B. Kalani Baray, MD, have reviewed the initial documentation provided by my medical scribe, and affirm that it is an accurate restatement of my dictated record of services. I understand and acknowledge that I am responsible for the accuracy of the documentation.      Signed on October 13, 2019:   Randie Tallarico B. Serenity Fortner, MD, FACS  General Surgery, Robotics, Advanced Laparoscopy, and   Esophageal Surgery   [facility]   [facility]

## 2019-10-13 NOTE — Progress Notes (Signed)
[facility] Patient Consult    Referring MD: Eloy End, MD  Patient's PCP: Dara Lords, DO    C/C: Gallstones    First presented approximately 1-5 Years ago. Reports new or worsening Sx.    Reports abdominal pain    Denies nausea, vomiting, fever, persistent cough and chills    Pain   Abdomen: laterality: epigastric and RUQ, quality: burning and onset/duration: intermittent after eating; sometimes sharp, shooting that radiated across chest  Average pain: 2.5/10  Worst pain: 9.5/10  Current pain: 2.5/10    BM: No problems    Recent DI:   CT Chest w Cont 09/2019    CT pancreas - 08/22/19 at [facility]    [place] abd - 08/17/19 at [facility]      Prior Surgeries:     Revision of bilateral breast reconstruction, revision of abd donor site - 02/20/12 at [facility] with Cindie Laroche, MD, report in [facility]    Evacuation of hematoma, left breast reconstruction, revision of left breast mastectomy skin - 10/03/10 at [facility] with Cindie Laroche, MD, report in [facility]    Bilateral skin-sparing total mastectomies, left and right immediate breast reconstruction, harvest of left superficial inferior epigastric vein for venous salvage, immediate reconstruction of bilateral breast with free deep inferior epigastric perforator flap... - 09/11/10 at [facility] with Cory Roughen, MD, Cindie Laroche, MD and Tawny Asal, MD report in [place]    Laparoscopic-assisted vaginal hysterectomy and bilateral salpingo-oophorectomy - 07/09/04 at [facility] with Vic Blackbird, MD, report in [facility]      Confirmed:  [x]  PCP [x]  Pharmacy   [x]  Allergies [x]  Surgical Hx   [x]  Medications [x]  Occupation

## 2019-10-14 ENCOUNTER — Telehealth (INDEPENDENT_AMBULATORY_CARE_PROVIDER_SITE_OTHER): Payer: Self-pay | Admitting: Surgery

## 2019-10-14 NOTE — Telephone Encounter (Signed)
Physician performing procedure:  Dr.White  Surgery Scheduling Logistics:   Procedure: RA lap chole w/ grams  Date: 03/11  Location of Procedure: [facility]  Case status: OPO (Inpatient, outpatient, DSU, Addendum E)  Diagnosis/ICD10: K80.20  Primary CPT: [phone]  Additional CPT(s) including possible procedures     Insurance:  Insurance Company: [employer] Telephone Number:   Group Number: [id number]    Subscriber ID: [id number]  Insurance Rep: Ramiro Harvest    Pre Authorization Needed:  NO  PA Number:   Authorization Status:  NONE NEEDED  Reference #:   Auth Received Date/Time: 10/14/19  Auth Received Via: Senaida Lange  If auth received via website insert Screenshot here:   Fax, or Letter scanned to media: NO /Date    Referral from PCP Needed:    Second Opinion Needed:

## 2019-10-17 ENCOUNTER — Telehealth (HOSPITAL_BASED_OUTPATIENT_CLINIC_OR_DEPARTMENT_OTHER): Payer: No Typology Code available for payment source

## 2019-10-18 ENCOUNTER — Telehealth (INDEPENDENT_AMBULATORY_CARE_PROVIDER_SITE_OTHER): Payer: Self-pay | Admitting: Nursing

## 2019-10-18 DIAGNOSIS — C641 Malignant neoplasm of right kidney, except renal pelvis: Secondary | ICD-10-CM

## 2019-10-18 NOTE — Telephone Encounter (Signed)
Called pt to have labs completed in preparation for surgery with Dr. Deliah Boston on 10/27/19. Left VM for pt to call back.

## 2019-10-18 NOTE — Addendum Note (Signed)
Addended by: Dalbert Garnet on: 10/18/2019 11:50 AM     Modules accepted: Orders

## 2019-10-19 ENCOUNTER — Ambulatory Visit: Payer: No Typology Code available for payment source

## 2019-10-27 ENCOUNTER — Ambulatory Visit (HOSPITAL_COMMUNITY): Payer: Self-pay | Admitting: Urology

## 2019-10-27 ENCOUNTER — Other Ambulatory Visit (INDEPENDENT_AMBULATORY_CARE_PROVIDER_SITE_OTHER): Payer: Self-pay | Admitting: Urology

## 2019-10-27 ENCOUNTER — Ambulatory Visit
Admission: RE | Admit: 2019-10-27 | Discharge: 2019-10-28 | Disposition: A | Discharge status: Home / Self Care | Payer: No Typology Code available for payment source | Attending: Urology | Admitting: Urology

## 2019-10-27 DIAGNOSIS — Z853 Personal history of malignant neoplasm of breast: Secondary | ICD-10-CM | POA: Insufficient documentation

## 2019-10-27 DIAGNOSIS — N2889 Other specified disorders of kidney and ureter: Secondary | ICD-10-CM | POA: Insufficient documentation

## 2019-10-27 DIAGNOSIS — C641 Malignant neoplasm of right kidney, except renal pelvis: Secondary | ICD-10-CM | POA: Insufficient documentation

## 2019-10-27 DIAGNOSIS — Z923 Personal history of irradiation: Secondary | ICD-10-CM | POA: Insufficient documentation

## 2019-10-27 DIAGNOSIS — K8010 Calculus of gallbladder with chronic cholecystitis without obstruction: Secondary | ICD-10-CM

## 2019-10-27 DIAGNOSIS — R12 Heartburn: Secondary | ICD-10-CM | POA: Insufficient documentation

## 2019-10-27 DIAGNOSIS — K838 Other specified diseases of biliary tract: Secondary | ICD-10-CM

## 2019-10-27 DIAGNOSIS — Z885 Allergy status to narcotic agent status: Secondary | ICD-10-CM | POA: Insufficient documentation

## 2019-10-27 DIAGNOSIS — Z1501 Genetic susceptibility to malignant neoplasm of breast: Secondary | ICD-10-CM | POA: Insufficient documentation

## 2019-10-27 HISTORY — DX: Malignant neoplasm of right kidney, except renal pelvis: C64.1

## 2019-10-27 HISTORY — PX: PR LAPAROSCOPY SURG PARTIAL NEPHRECTOMY: 50543

## 2019-10-27 HISTORY — PX: CHOLECYSTECTOMY: SHX5018

## 2019-10-27 LAB — TYPE AND SCREEN
ABO/Rh: A NEG
Antibody Screen: NEGATIVE

## 2019-10-27 LAB — BLOOD TYPE CONFIRMATION: ABO/Rh: A NEG

## 2019-10-28 ENCOUNTER — Telehealth (INDEPENDENT_AMBULATORY_CARE_PROVIDER_SITE_OTHER): Payer: Self-pay | Admitting: Surgery

## 2019-10-28 ENCOUNTER — Telehealth (INDEPENDENT_AMBULATORY_CARE_PROVIDER_SITE_OTHER): Payer: Self-pay | Admitting: Urology

## 2019-10-28 LAB — BASIC METABOLIC PANEL
Anion Gap: 6 (ref 4–12)
Calcium: 8.7 mg/dL — ABNORMAL LOW (ref 8.9–10.2)
Carbon Dioxide, Total: 26 meq/L (ref 22–32)
Chloride: 107 meq/L (ref 98–108)
Creatinine: 0.64 mg/dL (ref 0.38–1.02)
Glucose: 118 mg/dL (ref 62–125)
Potassium: 3.9 meq/L (ref 3.6–5.2)
Sodium: 139 meq/L (ref 135–145)
Urea Nitrogen: 12 mg/dL (ref 8–21)
eGFR by CKD-EPI: >60 mL/min/{1.73_m2} (ref >59)

## 2019-10-28 LAB — CBC, DIFF
% Basophils: 0 %
% Eosinophils: 0 %
% Immature Granulocytes: 0 %
% Lymphocytes: 9 %
% Monocytes: 6 %
% Neutrophils: 85 %
% Nucleated RBC: 0 %
Absolute Eosinophil Count: 0 10*3/uL (ref 0.00–0.50)
Absolute Lymphocyte Count: 1.06 10*3/uL (ref 1.00–4.80)
Basophils: 0.02 10*3/uL (ref 0.00–0.20)
Hematocrit: 34 % — ABNORMAL LOW (ref 36–45)
Hemoglobin: 11.1 g/dL — ABNORMAL LOW (ref 11.5–15.5)
Immature Granulocytes: 0.05 10*3/uL (ref 0.00–0.05)
MCH: 29.4 pg (ref 27.3–33.6)
MCHC: 32.3 g/dL (ref 32.2–36.5)
MCV: 91 fL (ref 81–98)
Monocytes: 0.65 10*3/uL (ref 0.00–0.80)
Neutrophils: 9.89 10*3/uL — ABNORMAL HIGH (ref 1.80–7.00)
Nucleated RBC: 0 10*3/uL
Platelet Count: 231 10*3/uL (ref 150–400)
RBC: 3.77 10*6/uL — ABNORMAL LOW (ref 3.80–5.00)
RDW-CV: 12.4 % (ref 11.6–14.4)
WBC: 11.67 10*3/uL — ABNORMAL HIGH (ref 4.3–10.0)

## 2019-10-28 LAB — PHOSPHATE: Phosphate: 4.5 mg/dL (ref 2.5–4.5)

## 2019-10-28 LAB — MAGNESIUM: Magnesium: 1.9 mg/dL (ref 1.8–2.4)

## 2019-10-28 NOTE — Telephone Encounter (Signed)
Called Amber Fletcher for post operative evaluation and to schedule telemedicine visit with Dr. Deliah Boston to review pathology.    Amber Fletcher denies fever, nausea/vomiting, chest pain, and shortness of breath. Endorses adequate urine output with no noticeable hematuria or clots. Amber Fletcher states she is not in significant pain and will try to manage with tylenol or ibuprofen but is picking up narcotic rx just in case. No BM at this time. Instructed to drink lots of water, take stool softener as prescribed, and ambulate as tolerated. Amber Fletcher scheduled for post operative telemedicine visit on 11/02/19. Amber Fletcher will notify clinic with any concerning symptoms including fevers, chills, nausea/vomiting, inability to void, large clots in urine, chest pain/SOB, or debilitating pain.

## 2019-10-28 NOTE — Telephone Encounter (Signed)
Called and LVM for pt to schedule a post-op appt with Dr.Dash to review pathology. Provided the [facility] phone number for a return call.

## 2019-10-28 NOTE — Telephone Encounter (Signed)
RETURN CALL: Voicemail - Detailed Message      SUBJECT:  General Message     MESSAGE: Patient has app on 3/29 however she is wondering if that can be a Telemed app? If not she is wanting to reschedule it. She will be out of town beginning on the 26th of March. She is wanting to know if there are any appointments before then.

## 2019-10-31 ENCOUNTER — Other Ambulatory Visit: Payer: Self-pay

## 2019-11-01 ENCOUNTER — Encounter (INDEPENDENT_AMBULATORY_CARE_PROVIDER_SITE_OTHER): Payer: Self-pay | Admitting: Urology

## 2019-11-02 ENCOUNTER — Telehealth (INDEPENDENT_AMBULATORY_CARE_PROVIDER_SITE_OTHER): Payer: No Typology Code available for payment source | Admitting: Urology

## 2019-11-02 ENCOUNTER — Encounter (INDEPENDENT_AMBULATORY_CARE_PROVIDER_SITE_OTHER): Payer: Self-pay | Admitting: Urology

## 2019-11-02 DIAGNOSIS — C641 Malignant neoplasm of right kidney, except renal pelvis: Secondary | ICD-10-CM

## 2019-11-02 NOTE — Progress Notes (Signed)
UROLOGY TELEHEALTH NOTE    ID/CC:    Right RCC    History of Present Illness:    Amber Fletcher is a 64 year old female who returns  to me for right RCC. She underewent right robotic partial nephrectomy on October 27, 2019 and simultaneous cholecystectomy performed by Dr. Dema Severin. 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 Gall bladder showed chronic cholecystitis and cholelithiasis. Overall recovering well. Having BM. She's eating a bland diet and not yet had full return of appetite. Her incisions are healing well.         Review of Systems:  A 14 system review of system was filled out by the patient.  Positives include appetite not returned. The rest of the review of system is negative and can be linked to in the chart dated today.    Past Medical Hx:    Patient Active Problem List   Diagnosis   . History of breast cancer   . Acquired absence of breast and absent nipple   . Clear cell renal cell carcinoma, right       Past Surgical Hx:    Past Surgical History:   Procedure Laterality Date   . CHOLECYSTECTOMY  10/27/2019   . LAPAROSCOPY SURG PARTIAL NEPHRECTOMY Right 10/27/2019   . TOTAL ABDOMINAL HYSTERECT W/WO RMVL TUBE OVARY      BRCA 2 prophylaxis   . UNLISTED PROCEDURE BREAST Bilateral     mastectomy       Social Hx:      reports that she has never smoked. She does not have any smokeless tobacco history on file..    Social History     Social History Narrative   . Not on file       Active Meds:    No outpatient medications have been marked as taking for the 11/02/19 encounter (Appointment) with Eloy End, MD.       OBJECTIVE:  Vital Signs:  There were no vitals taken for this visit.  Constitutional: No acute distress  Abdomen: By telehealth view, her incisions healing well    Review of Medical Images, tracings or specimens:    Labs Reviewed today with the patient include  Surgical pathology    ASSESSMENT:  Amber Fletcher is a 64 year old female one week post op from right  robotic partial nephrectomy on October 27, 2019 and simultaneous cholecystectomy with pathology of a 2.6 cm pT1a grade 2 clear cell RCC with negative margins.  Overall she is recovering well.    PLAN:    1.  We reviewed the NCCN guidelines for following a pT1a renal cell carcinoma.  We will obtain follow-up imaging in 6 months and follow her with a telehealth visit.  Imaging may be obtained locally in [place].  2.  Expect I explained additional imaging follow-up would include annual CT scans, chest imaging and labs.  3.  She will call sooner with any questions or concerns.     Distant Site Telemedicine Encounter    I conducted this encounter from [facility] via secure, live, face-to-face video conference with the patient. Rashaunda was located at home with her husband. Prior to the interview, the risks and benefits of telemedicine were discussed with the patient and verbal consent was obtained.      I spent a total time of 15 minutes face-to-face with the patient, of which more than 50% was spent counseling and coordinating care as outlined  in this note.

## 2019-11-02 NOTE — Patient Instructions (Signed)
1.  Let us obtain follow-up imaging in 6 months with a CT scan and follow with a telehealth visit.  Imaging may be obtained locally in [place].  2.  Additional imaging follow-up would include annual CT scans, chest imaging and labs.  3.  Please call sooner with any questions or concerns.

## 2019-11-11 ENCOUNTER — Encounter (INDEPENDENT_AMBULATORY_CARE_PROVIDER_SITE_OTHER): Payer: Self-pay

## 2019-11-11 NOTE — Progress Notes (Signed)
Telehealth visit    I conducted this encounter from [facility] via secure, live, face-to-face video conference with the patient due to ongoing COVID-19 pandemic recommendations regarding social distancing. Amber Fletcher was located at home. Present with me was Rebecca Eaton who acted as Presenter, broadcasting during the encounter.     Prior to the interview, the risks and benefits of telemedicine were discussed with the patient. Risks include equipment failure, poor image resolution, and information security issues.     All of the patients questions regarding telemedicine were discussed and answered. Patient voices understanding and gives verbal consent to proceed and knows there may be a copay/deductible: Yes      Patient provided government issued ID to verify identity and verified their name and birth:  Yes        Time spent with patient/guardian on this telephone visit: 10 minutes    Amber Fletcher 64 year old female POD#18 s/p RA laparoscopic cholecystectomy with indocyanine green cholangiography and robotic right partial nephrectomy with Dr. Deliah Boston who presents for post op visit.    Pt states she has been doing better. She has been trying to increase fat in her diet without problems.        Ht 5\' 3"  (1.6 m)    Wt 80.7 kg (178 lb)    BMI 31.53 kg/m   PHYSICAL EXAM: Limited by telehealth visit  General: Healthy, alert, no distress  Skin: No rashes or concerning lesions  Abdomen: Visually, all incisions are clean, dry and intact without signs of infection. No bulges.    Pathology 10/27/2019    A) Gallbladder, cholecystectomy:   Chronic cholecystitis and cholelithiasis.     B) Right renal mass, partial nephrectomy:   Clear cell renal cell carcinoma, ISUP grade 2, 2.6 cm, organ confined, all margins negative for tumor. See Synoptic Report for details.    ASSESSMENT/PLAN    64 year old female s/p RA laparoscopic cholecystectomy with indocyanine green cholangiography and doing well. Some twinges of pain and discomfort is expected  up to 6 - 8 weeks after surgery.    We discussed pathology report.     Continue to slowly increase fat in diet. Increase activity as tolerated. Avoid major core engagement activities for 6 weeks.  Call if develop fever, n/v or worsening pain.  Follow up as needed.    Scribe Attestation:  Portions of this note were documented by Rebecca Eaton, medical scribe, on 11/14/2019, with oversight by Dr. Dema Severin.

## 2019-11-13 ENCOUNTER — Other Ambulatory Visit: Payer: Self-pay

## 2019-11-14 ENCOUNTER — Encounter (INDEPENDENT_AMBULATORY_CARE_PROVIDER_SITE_OTHER): Payer: No Typology Code available for payment source | Admitting: Surgery

## 2019-11-14 ENCOUNTER — Encounter (INDEPENDENT_AMBULATORY_CARE_PROVIDER_SITE_OTHER): Payer: Self-pay | Admitting: Surgery

## 2019-11-14 ENCOUNTER — Telehealth (INDEPENDENT_AMBULATORY_CARE_PROVIDER_SITE_OTHER): Payer: No Typology Code available for payment source | Admitting: Surgery

## 2019-11-14 VITALS — Ht 63.0 in | Wt 178.0 lb

## 2019-11-14 DIAGNOSIS — Z9049 Acquired absence of other specified parts of digestive tract: Secondary | ICD-10-CM

## 2019-11-14 NOTE — Progress Notes (Signed)
Vella Kohler Larone Kliethermes, MD, have reviewed the initial documentation provided by my medical scribe, and affirm that it is an accurate restatement of my dictated record of services. I understand and acknowledge that I am responsible for the accuracy of the documentation.      Signed on November 14, 2019:   Tyria Springer B. Dema Severin, MD, FACS  General Surgery, Robotics, Advanced Laparoscopy, and   Esophageal Surgery   [facility] [place]  [facility]

## 2020-01-20 ENCOUNTER — Telehealth (INDEPENDENT_AMBULATORY_CARE_PROVIDER_SITE_OTHER): Payer: Self-pay | Admitting: Urology

## 2020-01-20 DIAGNOSIS — C641 Malignant neoplasm of right kidney, except renal pelvis: Secondary | ICD-10-CM

## 2020-01-20 NOTE — Telephone Encounter (Signed)
Labs and CT ordered.

## 2020-01-20 NOTE — Telephone Encounter (Signed)
Patient is scheduled for a TeleHealth visit with Dr.Dash in Sept 2021. Pt will complete CT at [place] and labs at [facility].

## 2020-01-23 NOTE — Telephone Encounter (Signed)
Orders faxed to both [facility].

## 2020-04-25 ENCOUNTER — Ambulatory Visit: Payer: Medicare Other

## 2020-04-30 ENCOUNTER — Other Ambulatory Visit: Payer: Self-pay

## 2020-05-01 ENCOUNTER — Encounter (HOSPITAL_BASED_OUTPATIENT_CLINIC_OR_DEPARTMENT_OTHER): Payer: Self-pay | Admitting: Urology

## 2020-05-01 ENCOUNTER — Other Ambulatory Visit (HOSPITAL_COMMUNITY): Payer: Self-pay

## 2020-05-01 NOTE — Progress Notes (Signed)
UROLOGY TELEHEALTH NOTE    Distant Site Telemedicine Encounter    I conducted this encounter from the [place] via secure, live, face-to-face video conference with the patient. Amber Fletcher  was located at home by herself.  I conducted this visit with the patient via telemedicine to preserve PPE and reduce exposure during the COVID-19 response. Prior to the interview, the risks and benefits of telemedicine were discussed with the patient and verbal consent was obtained.    The patient was introduced to all providers present who provided identification and the patient's identity was verified.    ID/CC:    Right RCC    History of Present Illness:    Recall, Amber Fletcher is a 64 year old female who returns for right RCC management. She underwent right robotic partial nephrectomy on October 27, 2019 and simultaneous cholecystectomy performed by Dr. Dema Severin. 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 Gall bladder showed chronic cholecystitis and cholelithiasis. Overall recovering well last visit in March. Having BMs. She's eating a bland diet and not yet had full return of appetite. Her incisions are healing well.     Since then, states she has recovered to normal. No further surgeries. Still having normal bowel movements. Diet consists of less fatty foods since cholecystectomy. Mostly chicken and vegetables. No fevers, no nausea, vomiting, weight loss. No hematuria. 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 She likewise denies further tachycardia, palpitations, weight gain, or headaches/dizziness/other sequelae of hypertension to suggest functional adrenal tumor.           Past Medical Hx:        Patient Active Problem List   Diagnosis   . History of breast cancer   . Acquired absence of  breast and absent nipple   . Clear cell renal cell carcinoma, right       Past Surgical Hx:          Past Surgical History:   Procedure Laterality Date   . CHOLECYSTECTOMY  10/27/2019   . LAPAROSCOPY SURG PARTIAL NEPHRECTOMY Right 10/27/2019   . TOTAL ABDOMINAL HYSTERECT W/WO RMVL TUBE OVARY      BRCA 2 prophylaxis   . UNLISTED PROCEDURE BREAST Bilateral     mastectomy     Social Hx:    Reports that she has never smoked. She does not have any smokeless tobacco history on file..        Active Meds:    Omeprazole.     OBJECTIVE:  Vital Signs:  There were no vitals taken for this visit.  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 Overall she continues to recover well. Adrenal Adenoma stable. Counseled on rarity of adrenal carcinomas. No evidence of symptoms, and she is interested in monitoring this for now. She understands to call  if this changes.     PLAN:    1.  We reviewed the NCCN guidelines for following a pT1a renal cell carcinoma.    2. I explained additional imaging follow-up would include annual CT scans, chest imaging and labs for the next 3 years.   3.  She will call sooner with any questions or concerns.   4. Follow up in 1 year for evaluation

## 2020-05-02 ENCOUNTER — Ambulatory Visit: Payer: Medicare Other | Attending: Urology | Admitting: Urology

## 2020-05-02 DIAGNOSIS — C641 Malignant neoplasm of right kidney, except renal pelvis: Secondary | ICD-10-CM | POA: Insufficient documentation

## 2020-05-02 NOTE — Patient Instructions (Signed)
Thank you for seeing [place] in clinic today!    1.  We reviewed the NCCN guidelines for following a pT1a renal cell carcinoma.    2. I explained additional imaging follow-up would include annual CT scans, chest imaging and labs for the next 3 years.   3.  She will call sooner with any questions or concerns.   4. Follow up in 1 year to review images.

## 2020-05-02 NOTE — Progress Notes (Signed)
Wenda Low, saw and evaluated this patient with Dr. Sanjuan Dame, MD Resident. I have reviewed the findings and management plan that we developed together, as he has documented. I (1) personally performed the patient's physical exam, which was deferred for telehealth and reviewed all pertinent (2) laboratory studies, which was her POC creatinine on April 25, 2020 at 0.7 mg/dL and radiologic studies, which was her CT scan from April 25, 2020 that showed no evidence of recurrence. I provided additional counseling concerning the patient's condition and treatment plan. (3) Specifically, we reviewed the NCCN guidelines for follow-up.  We recommend annual labs, CT and chest imaging.      Distant Site Telemedicine Encounter    I conducted this encounter from [facility] via secure, live, face-to-face video conference with the patient. Kenyetta was located at home by herself.  I reviewed the risks and benefits of telemedicine as pertinent to this visit and the patient agreed to proceed.    .I spent a total of 25 minutes for the patient's care on the date of the service.

## 2020-08-03 ENCOUNTER — Encounter (INDEPENDENT_AMBULATORY_CARE_PROVIDER_SITE_OTHER): Payer: Self-pay | Admitting: Family

## 2021-01-30 ENCOUNTER — Encounter (HOSPITAL_BASED_OUTPATIENT_CLINIC_OR_DEPARTMENT_OTHER): Payer: Self-pay | Admitting: Urology

## 2021-03-21 ENCOUNTER — Telehealth (HOSPITAL_BASED_OUTPATIENT_CLINIC_OR_DEPARTMENT_OTHER): Payer: Self-pay | Admitting: Urology

## 2021-03-21 NOTE — Telephone Encounter (Signed)
Sent lab order  Comprehensive Metabolic Panel    to [phone]    [facility]     Thank you

## 2021-04-23 ENCOUNTER — Other Ambulatory Visit (HOSPITAL_COMMUNITY): Payer: Self-pay

## 2021-05-15 ENCOUNTER — Telehealth (HOSPITAL_BASED_OUTPATIENT_CLINIC_OR_DEPARTMENT_OTHER): Payer: Self-pay

## 2021-05-15 ENCOUNTER — Ambulatory Visit: Payer: Medicare Other | Attending: Urology | Admitting: Urology

## 2021-05-15 ENCOUNTER — Other Ambulatory Visit (HOSPITAL_COMMUNITY): Payer: Self-pay

## 2021-05-15 VITALS — Ht 63.0 in | Wt 180.0 lb

## 2021-05-15 DIAGNOSIS — C641 Malignant neoplasm of right kidney, except renal pelvis: Secondary | ICD-10-CM | POA: Insufficient documentation

## 2021-05-15 NOTE — Progress Notes (Signed)
UROLOGY TELEHEALTH NOTE    ID/CC:    Right RCC    History of Present Illness:    Amber Fletcher is a 65 year old female who returns  to me for right RCC. She underwent right robotic partial nephrectomy on October 27, 2019 and simultaneous cholecystectomy performed by Dr. Dema Severin. 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 Gall bladder showed chronic cholecystitis and cholelithiasis. Her GI symptoms have improved.  She underwent follow-up CT scan on April 23, 2021.  There is no evidence of recurrence.  Findings of left left adrenal nodule consistent with a benign adenoma.        Review of Systems:  A 14 system review of system was filled out by the patient.  Positives include GI symptoms improved. The rest of the review of system is negative and can be linked to in the chart dated today.    Past Medical Hx:    Patient Active Problem List   Diagnosis    History of breast cancer    Acquired absence of breast and absent nipple    Clear cell renal cell carcinoma, right (HCC)    Renal cell cancer, right    S/P laparoscopic cholecystectomy       Past Surgical Hx:    Past Surgical History:   Procedure Laterality Date    CHOLECYSTECTOMY  10/27/2019    [place] with Dr. Dema Severin    PR LAPAROSCOPY SURG PARTIAL NEPHRECTOMY Right 10/27/2019    PR TOTAL ABDOMINAL HYSTERECT W/WO RMVL TUBE OVARY      BRCA 2 prophylaxis    PR UNLISTED PROCEDURE BREAST Bilateral     mastectomy       Social Hx:      reports that she has never smoked. She does not have any smokeless tobacco history on file..    Social History     Social History Narrative    Not on file       Active Meds:    Outpatient Medications Marked as Taking for the 05/15/21 encounter (Telemedicine) with Eloy End, MD   Medication Sig Dispense Refill    Ascorbic Acid (VITAMIN C OR) Take by mouth daily.      CALCIUM OR Take by mouth daily.      Ergocalciferol (VITAMIN D OR) Take by mouth daily.      Multiple Vitamin (MULTIVITAMIN OR)  Take by mouth daily.      omeprazole 40 MG DR capsule Take 40 mg by mouth daily on an empty stomach.          OBJECTIVE:  Vital Signs:  There were no vitals taken for this visit.  Constitutional: No acute distress  No exam for telehealth visit.    Review of Medical Images, tracings or specimens:  I reviewed the images and report of her April 23, 2021 CT of the abdomen and pelvis.    Labs Reviewed today with the patient include  Admission on 10/27/2019, Discharged on 10/28/2019   Component Date Value Ref Range Status    ABO/Rh 10/27/2019 A NEGATIVE   Final    Units Ordered 10/27/2019 None   Final    Physician Instructions 10/27/2019 None   Final    ABO/Rh 10/27/2019 A NEGATIVE   Final    Antibody Screen 10/27/2019 NEGATIVE   Final    Crossmatch Expiration 10/27/2019 10/30/2019   Final    WBC 10/28/2019 11.67 (A) 4.3 - 10.0 10*3/uL Final  RBC 10/28/2019 3.77 (A) 3.80 - 5.00 10*6/uL Final    Hemoglobin 10/28/2019 11.1 (A) 11.5 - 15.5 g/dL Final    Hematocrit 10/28/2019 34 (A) 36 - 45 % Final    MCV 10/28/2019 91  81 - 98 fL Final    MCH 10/28/2019 29.4  27.3 - 33.6 pg Final    MCHC 10/28/2019 32.3  32.2 - 36.5 g/dL Final    Platelet Count 10/28/2019 231  150 - 400 10*3/uL Final    RDW-CV 10/28/2019 12.4  11.6 - 14.4 % Final    % Neutrophils 10/28/2019 85  % Final    % Lymphocytes 10/28/2019 9  % Final    % Monocytes 10/28/2019 6  % Final    % Eosinophils 10/28/2019 0  % Final    % Basophils 10/28/2019 0  % Final    % Immature Granulocytes 10/28/2019 0  % Final    Neutrophils 10/28/2019 9.89 (A) 1.80 - 7.00 10*3/uL Final    Absolute Lymphocyte Count 10/28/2019 1.06  1.00 - 4.80 10*3/uL Final    Monocytes 10/28/2019 0.65  0.00 - 0.80 10*3/uL Final    Absolute Eosinophil Count 10/28/2019 0.00  0.00 - 0.50 10*3/uL Final    Basophils 10/28/2019 0.02  0.00 - 0.20 10*3/uL Final    Immature Granulocytes 10/28/2019 0.05  0.00 - 0.05 10*3/uL Final    Nucleated RBC 10/28/2019 0.00  0.00 10*3/uL  Final    % Nucleated RBC 10/28/2019 0  % Final    Sodium 10/28/2019 139  135 - 145 meq/L Final    Potassium 10/28/2019 3.9  3.6 - 5.2 meq/L Final    Chloride 10/28/2019 107  98 - 108 meq/L Final    Carbon Dioxide, Total 10/28/2019 26  22 - 32 meq/L Final    Anion Gap 10/28/2019 6  4 - 12 Final    Glucose 10/28/2019 118  62 - 125 mg/dL Final    Urea Nitrogen 10/28/2019 12  8 - 21 mg/dL Final    Creatinine 10/28/2019 0.64  0.38 - 1.02 mg/dL Final    Calcium 10/28/2019 8.7 (A) 8.9 - 10.2 mg/dL Final    eGFR by CKD-EPI 10/28/2019 >60  >59 mL/min/[1.73_m2] Final    GFR, Information 10/28/2019 Calculated GFR by CKD-EPI equation. Inaccurate with changing renal function. See http://depts.YourCloud[employer].fr.html.   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  Overall she is recovering well and many of her GI symptoms are better.    PLAN:    1. Follow in one year with new CT scan. We will fax order to [facility]. [facility].  2. Please undergo chest x-ray when she is back in December. We will fax this order too.       Distant Site Telemedicine Encounter      I conducted this encounter via secure, live, face-to-face video conference with the patient. I reviewed the risks and benefits of telemedicine as pertinent to this visit and the patient agreed to proceed.    Provider Location: On-site location (clinic, hospital, on-site office)  Patient Location: At home  Present with patient: No one else present     I  spent a total of 20 minutes for the patient's care on the date of the service.

## 2021-05-15 NOTE — Telephone Encounter (Signed)
1. Please get recent lab results from [facility] in [place] done Sept.   2. Follow in one year by telehealth with new CT fax order to [place]. [facility].   3. Please fax order chest x-ray order for this December 2022.     Faxed imaging orders to [place]:  [email or web address]  Fax: [phone]  - fax request

## 2021-05-15 NOTE — Patient Instructions (Signed)
Follow in one year with new CT scan. We will fax order to [facility]. [facility].  Please undergo chest x-ray when you're back in December. We will fax this order too.

## 2021-07-30 ENCOUNTER — Encounter (HOSPITAL_BASED_OUTPATIENT_CLINIC_OR_DEPARTMENT_OTHER): Payer: Self-pay | Admitting: Urology

## 2021-07-30 DIAGNOSIS — C641 Malignant neoplasm of right kidney, except renal pelvis: Secondary | ICD-10-CM

## 2021-07-30 DIAGNOSIS — R911 Solitary pulmonary nodule: Secondary | ICD-10-CM

## 2021-08-01 NOTE — Telephone Encounter (Signed)
I sent My Chart message reply. I agree with chest CT now. I wrote order to fax to [facility]. [facility].  Please obtain report and images ASAP after it has been done.

## 2021-08-02 NOTE — Telephone Encounter (Signed)
Order for CT Chest w wo Contrast faxed to :  [facility].  Locations   Billing scheduling: [phone] phone: [phone] toll free: [phone] fax: [phone]    Pt notified via ecare.    Billie Lade RN, [facility] Float Team

## 2022-02-26 ENCOUNTER — Telehealth (HOSPITAL_BASED_OUTPATIENT_CLINIC_OR_DEPARTMENT_OTHER): Payer: Self-pay | Admitting: Urology

## 2022-02-26 NOTE — Telephone Encounter (Signed)
Patient called in asking [place] to fax her imaging requests to [facility] in [place]    3 imaging requests faxed to [phone] at 11:20am on 02/26/22

## 2022-05-06 NOTE — Progress Notes (Signed)
 [facility] note      Amber Fletcher is a 66 y.o. with a chief complaint of Follow-up (Labial Abscess)         Assessment and Plan:     1. Labial abscess  Resolved. No fluid or nodular tissue of the labia. Discussed lubrication during intercourse to avoid friction or injury. Complete antibiotics, doxycycline 100 mg TWICE A DAY.  Red flag and return precautions reviewed.     RTC during the summer for annual wellness          Subjective:   Amber Fletcher is a 66 year old female here for follow-up for labial cyst.  Was seen on 05/01/2022 for painful lump of the labia.  I&D was completed.  Culture was negative.  Patient was prescribed doxycycline.  She is here for reevaluation.    Overall she is feeling well. She believes that she had an injury during intercourse as the cyst showed up a couple days after. She did not use lubricant.     Currently no pain, drainage, or bleeding  Does have some itching to the area as it has healed               Review of Systems  Pertinent reviewed in HPI    Objective:     Vitals:    05/06/22 1059   BP: 132/80   Pulse: 69   SpO2: 98%   Weight: 85.9 kg (189 lb 4.8 oz)     Body mass index is 34.18 kg/m.     Wt Readings from Last 3 Encounters:   05/06/22 85.9 kg (189 lb 4.8 oz)   02/10/22 84.3 kg (185 lb 14.4 oz)   02/07/21 83 kg (183 lb)        Physical Exam  Genitourinary:     Exam position: Lithotomy position.      Vagina: Normal.      Comments: Chaperone declined   Normal external exam. No lesions or cysts. No open area of concern.

## 2022-05-13 NOTE — Progress Notes (Signed)
UROLOGY CLINIC NOTE    ID/CC:    Chief Complaint   Patient presents with    Follow-Up      Annual f/u and CT review. Patient reports that she is doing very well.       History of Present Illness:    Amber Fletcher is a 66 year old woman who returns to me for right RCC. She underwent right robotic partial nephrectomy on October 27, 2019 and simultaneous cholecystectomy performed by Dr. Dema Severin. 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 Gall bladder showed chronic cholecystitis and cholelithiasis. Her GI symptoms have improved.      Her labs on March 10, 2022 were normal.  Her serum creatinine was 0.74 ng/mL.    Her most recent CT scan from March 17, 2022 shows no evidence of recurrence.  There is a stable 2.4 cm left adrenal adenoma.    She denies flank pain or gross hematuria.  Her weight remains stable.      Review of Systems:  A 14 system review of system was filled out by the patient.  Feeling well overall.  The rest of the review of system is negative and can be linked to in the chart dated today.    Past Medical Hx:    Patient Active Problem List   Diagnosis    History of breast cancer    Acquired absence of breast and absent nipple    Clear cell renal cell carcinoma, right (HCC)    Renal cell cancer, right (HCC)    S/P laparoscopic cholecystectomy       Past Surgical Hx:    Past Surgical History:   Procedure Laterality Date    CHOLECYSTECTOMY  10/27/2019    [place] with Dr. Dema Severin    PR LAPAROSCOPY SURG PARTIAL NEPHRECTOMY Right 10/27/2019    PR TOTAL ABDOMINAL HYSTERECT W/WO RMVL TUBE OVARY      BRCA 2 prophylaxis    PR UNLISTED PROCEDURE BREAST Bilateral     mastectomy       Social Hx:      reports that she has never smoked. She does not have any smokeless tobacco history on file..    Social History     Social History Narrative    Not on file       Active Meds:    Outpatient Medications Marked as Taking for the 05/14/22 encounter (Office Visit) with Eloy End, MD    Medication Sig Dispense Refill    Ascorbic Acid (VITAMIN C OR) Take by mouth daily.      CALCIUM OR Take by mouth daily.      Ergocalciferol (VITAMIN D OR) Take by mouth daily.      Multiple Vitamin (MULTIVITAMIN OR) Take by mouth daily.      omeprazole 40 MG DR capsule Take 1 capsule (40 mg) by mouth daily on an empty stomach.         OBJECTIVE:  Vital Signs:  Pulse 91   Temp 36.1 C (Temporal)   SpO2 98%   Constitutional: No acute distress  Constitutional: No acute distress  Abdomen: No abdominal masses.   No tenderness. No CVA tenderness. No hernias noted.  Extremities:  No edema    Review of Medical Images, tracings or specimens:    Labs Reviewed today with the patient include      CT Chest Abdomen and Pelvis wo Contrast 03/17/2022  IMPRESSION:   1. Stable postoperative changes  from a partial right upper pole nephrectomy. No   evidence of recurrent, residual or metastatic disease in the chest, abdomen or   pelvis.   2. 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 No evidence of recurrent, residual, or metastatic disease. Left adrenal adenoma stable at 2.4 cm.     PLAN:    Follow up in one year with labs and CT and CXR.   Telehealth follow-up appointment OK. Scans will be done at [facility]. [facility].  Call into the office sooner if any problems arise.     I, Noemi Chapel, scribe, attest that this documentation has been prepared under the direction and in the presence of Dr. Eloy End, MD.

## 2022-05-14 ENCOUNTER — Ambulatory Visit: Payer: Medicare Other | Attending: Urology | Admitting: Urology

## 2022-05-14 VITALS — HR 91 | Temp 96.9°F

## 2022-05-14 DIAGNOSIS — C641 Malignant neoplasm of right kidney, except renal pelvis: Secondary | ICD-10-CM | POA: Insufficient documentation

## 2022-05-14 NOTE — Patient Instructions (Addendum)
Follow in one year with labs and CT and chest x-ray. OK to follow in person or by telehealth. Scans at [facility]. [facility].  Call sooner with any problems.

## 2022-05-20 NOTE — Progress Notes (Signed)
I, Lynn Sissel, MD, personally performed the services described in this documentation, as scribed by Sarah Qureshi, in my presence.  I have reviewed and edited the documentation provided by the scribe, and attest that it is both accurate and complete.    Shaunda Tipping, MD  Urology

## 2022-05-23 ENCOUNTER — Encounter (HOSPITAL_BASED_OUTPATIENT_CLINIC_OR_DEPARTMENT_OTHER): Payer: Self-pay

## 2022-11-21 ENCOUNTER — Encounter (HOSPITAL_BASED_OUTPATIENT_CLINIC_OR_DEPARTMENT_OTHER): Payer: Self-pay | Admitting: Urology

## 2022-11-24 NOTE — Telephone Encounter (Signed)
Per patients request, imaging and lab orders faxed to [place]. [facility] @  [phone].     MyChart response sent informing patient this was completed.    Mathis Dad, CMA

## 2023-01-15 ENCOUNTER — Telehealth (HOSPITAL_BASED_OUTPATIENT_CLINIC_OR_DEPARTMENT_OTHER): Payer: Self-pay | Admitting: Urology

## 2023-01-15 NOTE — Telephone Encounter (Signed)
RETURN CALL: Voicemail - Detailed Message      SUBJECT:  General Message     MESSAGE: Patient would like to change in person to telemedicine for 7/17 appointment. Dr Sharilyn Sites told her it was ok. CCR attempted warm transfer but clinic busy helping other patients

## 2023-01-21 ENCOUNTER — Encounter (HOSPITAL_BASED_OUTPATIENT_CLINIC_OR_DEPARTMENT_OTHER): Payer: Self-pay | Admitting: Urology

## 2023-02-02 ENCOUNTER — Ambulatory Visit
Admission: RE | Admit: 2023-02-02 | Discharge: 2023-02-02 | Disposition: A | Discharge status: Home / Self Care | Payer: Medicare Other | Attending: Diagnostic Radiology | Admitting: Diagnostic Radiology

## 2023-02-02 ENCOUNTER — Ambulatory Visit (HOSPITAL_BASED_OUTPATIENT_CLINIC_OR_DEPARTMENT_OTHER): Payer: Medicare Other

## 2023-02-02 ENCOUNTER — Other Ambulatory Visit (HOSPITAL_BASED_OUTPATIENT_CLINIC_OR_DEPARTMENT_OTHER): Payer: Self-pay | Admitting: Urology

## 2023-02-02 DIAGNOSIS — C641 Malignant neoplasm of right kidney, except renal pelvis: Secondary | ICD-10-CM

## 2023-02-02 LAB — COMPREHENSIVE METABOLIC PANEL
ALT (GPT): 24 U/L (ref 7–33)
AST (GOT): 17 U/L (ref 9–38)
Albumin: 4.3 g/dL (ref 3.5–5.2)
Alkaline Phosphatase (Total): 65 U/L (ref 38–172)
Anion Gap: 7 (ref 4–12)
Bilirubin (Total): 0.5 mg/dL (ref 0.2–1.3)
Calcium: 9.1 mg/dL (ref 8.9–10.2)
Carbon Dioxide, Total: 29 meq/L (ref 22–32)
Chloride: 104 meq/L (ref 98–108)
Creatinine: 0.75 mg/dL (ref 0.38–1.02)
Glucose: 102 mg/dL (ref 62–125)
Potassium: 4.5 meq/L (ref 3.6–5.2)
Protein (Total): 7.4 g/dL (ref 6.0–8.2)
Sodium: 140 meq/L (ref 135–145)
Urea Nitrogen: 18 mg/dL (ref 8–21)
eGFR by CKD-EPI 2021: >60 mL/min/{1.73_m2} (ref >59)

## 2023-02-16 ENCOUNTER — Other Ambulatory Visit: Payer: Self-pay

## 2023-02-16 ENCOUNTER — Ambulatory Visit: Payer: Medicare Other

## 2023-03-03 ENCOUNTER — Other Ambulatory Visit (HOSPITAL_COMMUNITY): Payer: Self-pay

## 2023-03-04 ENCOUNTER — Encounter (HOSPITAL_BASED_OUTPATIENT_CLINIC_OR_DEPARTMENT_OTHER): Payer: Self-pay | Admitting: Urology

## 2023-03-04 ENCOUNTER — Ambulatory Visit: Payer: Medicare Other | Admitting: Urology

## 2023-03-04 VITALS — Ht 63.0 in | Wt 185.0 lb

## 2023-03-04 DIAGNOSIS — C641 Malignant neoplasm of right kidney, except renal pelvis: Secondary | ICD-10-CM

## 2023-03-04 NOTE — Patient Instructions (Signed)
Follow next year with new CT scan, chest x-ray and labs.  Call sooner with any issues.

## 2023-03-04 NOTE — Progress Notes (Signed)
UROLOGY CLINIC NOTE    ID/CC:    Chief Complaint   Patient presents with    Follow-Up      Annual OV, review imaging,        History of Present Illness:    Amber Fletcher is a 67 year old woman who returns to me for right robotic partial nephrectomy on 10/27/19 for a 2.6 cm pT1a clear cell RCC grade 2. Labs 02/02/23 with normal labs and CXR negative. 02/16/23 CT at [facility] negative. She is feeling better overall. Good appetite. Denies gross hematuria.         Review of Systems:  A 14 system review of system was filled out by the patient.  Positives include feeling well overall. The rest of the review of system is negative and can be linked to in the chart dated today.    Past Medical Hx:    Patient Active Problem List   Diagnosis    History of breast cancer    Acquired absence of breast and absent nipple    Clear cell renal cell carcinoma, right (HCC)    Renal cell cancer, right (HCC)    S/P laparoscopic cholecystectomy       Past Surgical Hx:    Past Surgical History:   Procedure Laterality Date    CHOLECYSTECTOMY  10/27/2019    [place] with Dr. Cliffton Asters    PR LAPAROSCOPY SURG PARTIAL NEPHRECTOMY Right 10/27/2019    PR TOTAL ABDOMINAL HYSTERECT W/WO RMVL TUBE OVARY      BRCA 2 prophylaxis    PR UNLISTED PROCEDURE BREAST Bilateral     mastectomy       Social Hx:      reports that she has never smoked. She does not have any smokeless tobacco history on file. She reports current alcohol use..    Social History     Social History Narrative    Not on file       Active Meds:    Outpatient Medications Marked as Taking for the 03/04/23 encounter (Telemedicine) with Marti Sleigh, MD   Medication Sig Dispense Refill    Ascorbic Acid (VITAMIN C OR) Take by mouth daily.      CALCIUM OR Take by mouth daily.      Ergocalciferol (VITAMIN D OR) Take by mouth daily.      Multiple Vitamin (MULTIVITAMIN OR) Take by mouth daily.      omeprazole 40 MG DR capsule Take 1 capsule (40 mg) by mouth daily on an empty stomach.          OBJECTIVE:  Vital Signs:  Ht 5\' 3"  (1.6 m)   Wt 83.9 kg (185 lb)   BMI 32.77 kg/m   Constitutional: No acute distress  No exam for telehealth visit.    Review of Medical Images, tracings or specimens:    Labs Reviewed today with the patient include  Orders Only on 02/02/2023   Component Date Value Ref Range Status    Sodium 02/02/2023 140  135 - 145 meq/L Final    Potassium 02/02/2023 4.5  3.6 - 5.2 meq/L Final    Chloride 02/02/2023 104  98 - 108 meq/L Final    Carbon Dioxide, Total 02/02/2023 29  22 - 32 meq/L Final    Anion Gap 02/02/2023 7  4 - 12 Final    Glucose 02/02/2023 102  62 - 125 mg/dL Final    Urea Nitrogen 02/02/2023 18  8 - 21 mg/dL Final  Creatinine 02/02/2023 0.75  0.38 - 1.02 mg/dL Final    Protein (Total) 02/02/2023 7.4  6.0 - 8.2 g/dL Final    Albumin 16/05/9603 4.3  3.5 - 5.2 g/dL Final    Bilirubin (Total) 02/02/2023 0.5  0.2 - 1.3 mg/dL Final    Calcium 54/04/8118 9.1  8.9 - 10.2 mg/dL Final    AST (GOT) 14/78/2956 17  9 - 38 U/L Final    Alkaline Phosphatase (Total) 02/02/2023 65  38 - 172 U/L Final    ALT (GPT) 02/02/2023 24  7 - 33 U/L Final    eGFR by CKD-EPI 2021 02/02/2023 >60  >59 mL/min/1.73_m2 Final       ASSESSMENT:  Amber Fletcher is a 67 year old woman with right RCC who underwent right robotic partial nephrectomy returns to me for right robotic partial nephrectomy on 10/27/19 for a 2.6 cm pT1a clear cell RCC grade 2. NED and labs normal.    PLAN:    Follow next year with new CT scan, chest x-ray and labs.  Call sooner with any issues.     Distant Site Telemedicine Encounter  I conducted this encounter via secure, live, face-to-face video conference with the patient. I reviewed the risks and benefits of telemedicine as pertinent to this visit and the patient agreed to proceed.    Provider Location: On-site location (clinic, hospital, on-site office)  Patient Location: At home    Present with patient: No one else present     I spent a total of 20 minutes for the  patient's care on the date of the service.

## 2023-03-23 LAB — CYTOPATHOLOGY NON-GYN

## 2023-06-25 LAB — PATHOLOGY, SURGICAL

## 2023-07-08 LAB — PATHOLOGY, SURGICAL

## 2023-07-10 LAB — PATHOLOGY, SURGICAL

## 2023-11-03 ENCOUNTER — Encounter (HOSPITAL_BASED_OUTPATIENT_CLINIC_OR_DEPARTMENT_OTHER): Payer: Self-pay | Admitting: Urology

## 2023-12-29 ENCOUNTER — Telehealth (HOSPITAL_BASED_OUTPATIENT_CLINIC_OR_DEPARTMENT_OTHER): Payer: Self-pay

## 2023-12-29 ENCOUNTER — Encounter (HOSPITAL_BASED_OUTPATIENT_CLINIC_OR_DEPARTMENT_OTHER): Payer: Self-pay

## 2023-12-29 NOTE — Progress Notes (Unsigned)
 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    PAST BREAST HISTORY:  Amber Fletcher was diagnosed with a screen detected left breast cancer in 2004.  She was treated with breast conservation and underwent a lumpectomy with sentinel lymph node biopsy, with staging T1b N0.  She received adjuvant radiation and 5 years of endocrine therapy.  All treatment completed through [facility].    She recalls being sent for genetic testing about a year following her diagnosis, and was found to have a pathogenic variant of BRCA2.    She elected to undergo risk-reducing mastectomy (skin-sparing) with DIEP reconstruction, performed by Drs. Calhoun/Said on 09/11/2010.  All tissue was benign.    HISTORY OF CURRENT BREAST PROBLEM:  Amber Fletcher states that she has just been "cruising along" since her RRM.  She recently noted a mass in her left medial chest wall in the last week or two, when lying supine in bed.  This has since developed some overlying skin redness, and she thought she better have it checked.  It is not painful.  She denies any other lumps or masses.    GYNECOLOGIC HISTORY:  G3P3.  Menarche at age 35.  First pregnancy at age 53.  She is post-menopausal.  LMP age 19 (s/p RR TVH-BSO).  OCP: none  HRT: none    PAST MEDICAL HISTORY:  Positive for BRCA2 pathogenic variant  Left breast cancer, 2004  Right clear-cell RCC, 2021.  Treated with R0 resection, followed by Dr. Pearlean Botts with annual CT CAP.  NED.  Next re-staging 02/2024.  Left adrenal adenoma, stable on 02/2024 CT  Osteoporosis  Hyperlipidemia  GERD    PAST  SURGICAL HISTORY:  Left lumpectomy with sentinel lymph node biopsy, 2004  Laparoscopic-assisted vaginal hysterectomy with BSO, 2005  Bilateral skin sparing risk-reducing mastectomy with bilateral DIEP, 2012  Laparoscopic cholecystectomy with right partial nephrectomy, 10/2019  I&D of labial abscess, 2023    MEDICATIONS:    Current Outpatient Medications:     Ascorbic Acid (VITAMIN C OR), Take by mouth daily., Disp: , Rfl:     CALCIUM OR, Take by mouth daily., Disp: , Rfl:     Ergocalciferol (VITAMIN D OR), Take by mouth daily., Disp: , Rfl:     Multiple Vitamin (MULTIVITAMIN OR), Take by mouth daily., Disp: , Rfl:     omeprazole 40 MG DR capsule, Take 1 capsule (40 mg) by mouth daily on an empty stomach., Disp: , Rfl:     rosuvastatin 10 MG tablet, Take 1 tablet (10 mg) by mouth every evening., Disp: , Rfl:     ALLERGIES:  Oxycodone    FAMILY HISTORY:  Her sister was diagnosed with breast cancer at age 56, and her niece (that sister's daughter) diagnosed at age 57.  Both women were found to be BRCA2 positive.      Tamla's maternal grandmother was diagnosed with breast cancer at age 7.    Amber Fletcher's two daughters are both BRCA2 positive, one of whom is followed at [facility].    Amber Fletcher's mother, who is 66 years old and has no  personal history of cancer, is also BRCA2 positive.  Amber Fletcher had 2 maternal uncles who died of prostate cancer.    SOCIAL HISTORY:  Lives in [place] with her spouse.  She has a total of 6 grandchildren, 3 of whom she is babysitting this week.  Never smoker.  Does not drink alcohol.  She is retired and previously worked for [employer] [place] .  Her exercise is limited and includes walking once or twice per week at 15-minute intervals.  ECOG of 0.    REVIEW OF SYSTEMS:  A 14 system review of systems was captured by the breast health intake form and reviewed.  Skin is remarkable for lump in the left chest wall skin.  Remainder systems are negative.    PHYSICAL EXAM:  BP 121/84   Pulse 77   Temp 36.7  C (Oral)   Resp 16   Wt 86.5 kg (190 lb 11.2 oz)   SpO2 97%   BMI 33.78 kg/m   General: Well-appearing adult female, no acute distress.  Breasts: Examined in the seated and supine positions.    Inspection: Breasts are surgically absent, with viable DIEP flaps in expected position.  Moderate asymmetry, with right flap much larger.  There is faint violaceous erythema noted over the left medial chest wall at the site of patient complaint.  Palpation: At the site of patient complaint is a 2 cm x 2.5 cm firm superficial mass with associated skin erythema, at 8:00 parasternal.  No other lumps or masses in the chest wall bilaterally.  Lymphatics: No head or neck, supra or infraclavicular, or axillary lymphadenopathy bilaterally.    Patient gave verbal permission for photographs to be included in her chart which are located in media.    The patient was asked if they would like to have a chaperone in the room for this exam.   The patient declined a chaperone.     ASSESSMENT AND PLAN:  Amber Fletcher is a 68 year old female with remote personal history of BRCA2 associated left breast cancer, treated with breast conservation prior to subsequent RRM with autologous reconstruction.  A self appreciated left chest wall mass was verified on CBE, and is considered suspicious for malignancy.    I completed CBE and reviewed findings with Amber Fletcher.  The mass is very firm with overlying skin discoloration, and I recommended skin punch biopsy to rule out malignancy, including radiation induced angiosarcoma.  Amber Fletcher was amenable, and signed written informed consent.  Procedure note will be outlined below.    Diagnostic imaging is also recommended, and Amber Fletcher was encouraged to schedule left diagnostic mammogram and targeted ultrasound on the way out of clinic.  Ideally she would do the studies at [facility], but if there is a long wait, she may choose to do them closer to home through [facility].    I will be in touch with  Amber Fletcher to coordinate next steps after her skin biopsy and imaging are complete.    It was a pleasure to meet with Amber Fletcher today.  I spent 60 minutes for the patient's care on the date of service, with 15 minutes spent on procedure.    PROCEDURE NOTE: SKIN PUNCH BIOPSY    After written informed consent was obtained, and 3 patient identifiers were verified in a timeout, patient was positioned supine on the exam room table.  The skin of the biopsy site was thoroughly cleansed with ChloraPrep solution.  Then 2 cc of 1% lidocaine was injected  into the dermis to create a wheal.  Once anesthesia was verified a 4 mm punch biopsy specimen was taken and placed in formalin.  The skin defect was then closed with two interrupted 4-0 nylon suture.  Hemostasis was achieved.  The biopsy site was then dressed with bacitracin, 2 x 2 gauze and Tegaderm dressing.  Patient tolerated the procedure well.    Following the procedure, I personally checked that the specimen cup was labeled correctly and the requisition matched.

## 2023-12-29 NOTE — Telephone Encounter (Signed)
 Amber Percy, RN of [facility] Nurse Navigation contacted patient by phone to provide education and care coordination for patient's new breast symptoms.  Patient was self-referred.    Patient's intention for visit: Receive Care at [facility]    Narrative of diagnosis/ reason for care/ goals of care:  Amber Fletcher has a history of invasive breast cancer with a BRCA2 mutation and has had bilateral mastectomies with DIEP flap reconstruction at [facility]. She has a new lump and is seeking evaluation and felt it would be best to go "back to the source" where she had prior surgery.     Is the patient scheduled or has seen another Breast Surgeon or Medical Oncologist? No, this is not a second opinion    Breast Signs and Symptoms: Over the years has felt various bumps which have been attributed to scar tissue.  Over the last couple of weeks, noticed lump on inside of left flap breast area has gotten bigger and there is now redness on the skin around the lump.  It is roughly marble sized and pretty hard to the touch.  Not painful though very limited sensation after surgeries.    Gynecological History: Patient is not taking hormonal medication  Patient is not menstruating. Reason Hysterectomy age 27    Cardiac History: Patient has no cardiac history    Genetic Testing: Patient's genetic testing was positive for BRCA2 mutation, 2004    Social Habit Assessment (ie tobacco, alcohol, recreational substances): none    Oncologic History:    Oncology History Overview   2004 left stage 1 breast cancer, T1bN0 s/p lumpectomy w/ SLNB, XRT, 5 years of endocrine therapy. BRCA2+  08/2010 bilateral prophylactic mastectomies followed by DIEP flap reconstruction     Relevant Medical History:    Past Medical History:   Diagnosis Date    Renal cell cancer, right (HCC) 10/27/2019    BRCA2 positive 2004      Relevant Surgical History:    Past Surgical History:   Procedure Laterality Date    CHOLECYSTECTOMY  10/27/2019     [facility] with Dr. Camilo Cella    PR LAPAROSCOPY SURG  PARTIAL NEPHRECTOMY Right 10/27/2019    PR TOTAL ABDOMINAL HYSTERECT W/WO RMVL TUBE OVARY      BRCA 2 prophylaxis    PR UNLISTED PROCEDURE BREAST Bilateral     mastectomy      Family History:     Family History   Problem Relation Age of Onset    Breast Cancer Sister     BRCA2 Positive  Daughter     BRCA2 Positive  Daughter       Social History:   Social History     Social History Narrative    12/2023: lives in [place] but in [place] with grandkids this week.  Daughter is followed in [facility] high risk clinic     Diagnostic work up: Coordinate per pathway.     Summary: Amber Fletcher will be scheduled with a breast health APP for evaluation of a new lump given her history of BRCA2 mutation and invasive breast cancer.    Date of Diagnosis N/a   Insurance Coverage Medicare Primary, [employer] Secondary   [facility] Discharge, Uninsured? No   Referral Source Phone    To be scheduled with: Breast APP NOW  Visit needed this week, BRCA2+, prior invasive breast cancer, new red lump has been growing over a couple weeks   BCONs Required? No   To be scheduled at: [facility]   Other Orders Placed: N/A  at this time   Diagnostics Needed Prior to Consult? N/A     To be Requested   Path   Date Description Facility Path re-read needed?    N/a                       Films   Date Description Facility -----    N/a- no breast imaging s/p b/l mastx  -----      -----      -----      -----   Records/Labs   Date Description Facility -----    N/a  -----      -----      -----      -----

## 2023-12-31 ENCOUNTER — Encounter (HOSPITAL_BASED_OUTPATIENT_CLINIC_OR_DEPARTMENT_OTHER): Payer: Self-pay

## 2023-12-31 ENCOUNTER — Ambulatory Visit: Attending: Physician Assistant | Admitting: Physician Assistant

## 2023-12-31 ENCOUNTER — Ambulatory Visit (HOSPITAL_BASED_OUTPATIENT_CLINIC_OR_DEPARTMENT_OTHER)

## 2023-12-31 VITALS — BP 121/84 | HR 77 | Temp 98.1°F | Resp 16 | Wt 190.7 lb

## 2023-12-31 DIAGNOSIS — L0889 Other specified local infections of the skin and subcutaneous tissue: Secondary | ICD-10-CM | POA: Insufficient documentation

## 2023-12-31 DIAGNOSIS — Z1501 Genetic susceptibility to malignant neoplasm of breast: Secondary | ICD-10-CM | POA: Insufficient documentation

## 2023-12-31 DIAGNOSIS — R222 Localized swelling, mass and lump, trunk: Secondary | ICD-10-CM | POA: Insufficient documentation

## 2023-12-31 DIAGNOSIS — Z1509 Genetic susceptibility to other malignant neoplasm: Secondary | ICD-10-CM | POA: Insufficient documentation

## 2023-12-31 DIAGNOSIS — Z1502 Genetic susceptibility to malignant neoplasm of ovary: Secondary | ICD-10-CM | POA: Insufficient documentation

## 2023-12-31 DIAGNOSIS — Z9012 Acquired absence of left breast and nipple: Secondary | ICD-10-CM | POA: Insufficient documentation

## 2023-12-31 DIAGNOSIS — D4819 Other specified neoplasm of uncertain behavior of connective and other soft tissue: Secondary | ICD-10-CM

## 2023-12-31 DIAGNOSIS — Z853 Personal history of malignant neoplasm of breast: Secondary | ICD-10-CM | POA: Insufficient documentation

## 2024-01-05 ENCOUNTER — Other Ambulatory Visit (HOSPITAL_BASED_OUTPATIENT_CLINIC_OR_DEPARTMENT_OTHER): Payer: Self-pay

## 2024-01-07 ENCOUNTER — Encounter (HOSPITAL_BASED_OUTPATIENT_CLINIC_OR_DEPARTMENT_OTHER): Payer: Self-pay | Admitting: Physician Assistant

## 2024-01-07 ENCOUNTER — Telehealth (HOSPITAL_BASED_OUTPATIENT_CLINIC_OR_DEPARTMENT_OTHER): Payer: Self-pay | Admitting: Urology

## 2024-01-07 NOTE — Telephone Encounter (Signed)
 RETURN CALL: Voicemail - Detailed Message      SUBJECT:  Appointment Request     REASON FOR VISIT: Return in about 1 year (around 03/03/2024) for CT CXR labs before appointment. Telemed if possible  PREFERRED DATE/TIME: around 7/17  REASON UNABLE TO APPOINT: [facility] can't appoint telemed

## 2024-01-08 ENCOUNTER — Telehealth (HOSPITAL_BASED_OUTPATIENT_CLINIC_OR_DEPARTMENT_OTHER): Payer: Self-pay | Admitting: Physician Assistant

## 2024-01-08 LAB — DERMATOLOGY SURGICAL PATHOLOGY

## 2024-01-08 NOTE — Telephone Encounter (Signed)
 I spoke to [place] regarding results of 5/15 skin punch biopsy, which indicates fat necrosis with inflammation, and no malignancy.  Discussed the possibility that skin punch biopsy has under sampled the lesion, and recommended that she still proceed with diagnostic breast imaging on 5/30, to confirm whether entire lesion looks consistent with fat necrosis.  She understands and agrees.  Will follow-up with her after breast imaging is complete.

## 2024-01-13 ENCOUNTER — Encounter (HOSPITAL_BASED_OUTPATIENT_CLINIC_OR_DEPARTMENT_OTHER): Payer: Self-pay | Admitting: Urology

## 2024-01-13 ENCOUNTER — Encounter (HOSPITAL_BASED_OUTPATIENT_CLINIC_OR_DEPARTMENT_OTHER): Payer: Self-pay

## 2024-01-13 NOTE — Telephone Encounter (Signed)
 Called patient, scheduled for telemedicine with Dr. Lindalee Retort on 7/16.

## 2024-01-15 ENCOUNTER — Encounter (HOSPITAL_BASED_OUTPATIENT_CLINIC_OR_DEPARTMENT_OTHER): Payer: Self-pay | Admitting: Urology

## 2024-01-15 ENCOUNTER — Ambulatory Visit
Admission: RE | Admit: 2024-01-15 | Discharge: 2024-01-15 | Disposition: A | Discharge status: Home / Self Care | Attending: Diagnostic Radiology | Admitting: Diagnostic Radiology

## 2024-01-15 ENCOUNTER — Other Ambulatory Visit (HOSPITAL_BASED_OUTPATIENT_CLINIC_OR_DEPARTMENT_OTHER): Payer: Self-pay | Admitting: Urology

## 2024-01-15 ENCOUNTER — Ambulatory Visit (HOSPITAL_BASED_OUTPATIENT_CLINIC_OR_DEPARTMENT_OTHER)
Admit: 2024-01-15 | Discharge: 2024-01-15 | Disposition: A | Discharge status: Home / Self Care | Source: Home / Self Care

## 2024-01-15 ENCOUNTER — Ambulatory Visit
Admission: RE | Admit: 2024-01-15 | Discharge: 2024-01-15 | Disposition: A | Discharge status: Home / Self Care | Attending: Student in an Organized Health Care Education/Training Program | Admitting: Student in an Organized Health Care Education/Training Program

## 2024-01-15 ENCOUNTER — Ambulatory Visit (HOSPITAL_BASED_OUTPATIENT_CLINIC_OR_DEPARTMENT_OTHER)

## 2024-01-15 DIAGNOSIS — R222 Localized swelling, mass and lump, trunk: Secondary | ICD-10-CM

## 2024-01-15 DIAGNOSIS — N641 Fat necrosis of breast: Secondary | ICD-10-CM | POA: Insufficient documentation

## 2024-01-15 DIAGNOSIS — C641 Malignant neoplasm of right kidney, except renal pelvis: Secondary | ICD-10-CM | POA: Insufficient documentation

## 2024-01-15 DIAGNOSIS — Z9012 Acquired absence of left breast and nipple: Secondary | ICD-10-CM | POA: Insufficient documentation

## 2024-01-15 LAB — COMPREHENSIVE METABOLIC PANEL
ALT (GPT): 22 U/L (ref 7–33)
AST (GOT): 18 U/L (ref 9–38)
Albumin: 4.4 g/dL (ref 3.5–5.2)
Alkaline Phosphatase (Total): 75 U/L (ref 38–172)
Anion Gap: 6 (ref 4–12)
Bilirubin (Total): 0.5 mg/dL (ref 0.2–1.3)
Calcium: 9.5 mg/dL (ref 8.9–10.2)
Carbon Dioxide, Total: 30 meq/L (ref 22–32)
Chloride: 103 meq/L (ref 98–108)
Creatinine: 0.71 mg/dL (ref 0.38–1.02)
Glucose: 111 mg/dL (ref 62–125)
Potassium: 4.1 meq/L (ref 3.6–5.2)
Protein (Total): 7.6 g/dL (ref 6.0–8.2)
Sodium: 139 meq/L (ref 135–145)
Urea Nitrogen: 13 mg/dL (ref 8–21)
eGFR by CKD-EPI 2021: >60 mL/min/{1.73_m2} (ref >59)

## 2024-01-18 ENCOUNTER — Telehealth (HOSPITAL_BASED_OUTPATIENT_CLINIC_OR_DEPARTMENT_OTHER): Payer: Self-pay | Admitting: Physician Assistant

## 2024-01-18 ENCOUNTER — Other Ambulatory Visit (HOSPITAL_BASED_OUTPATIENT_CLINIC_OR_DEPARTMENT_OTHER): Payer: Self-pay | Admitting: Physician Assistant

## 2024-01-18 DIAGNOSIS — R222 Localized swelling, mass and lump, trunk: Secondary | ICD-10-CM

## 2024-01-18 NOTE — Telephone Encounter (Signed)
 TC to review results of breast imaging from [facility] 5/30.  Left diagnostic mammogram/ultrasound show fat necrosis at the 9 o'clock position, which corresponds to the palpable finding.  Recent skin punch biopsy at that site also indicates fat necrosis with inflammation.  I have recommended to [facility], given her prior left-sided breast cancer and status as a carrier of a BRCA2 pathogenic variant, that we continue to monitor this area clinically for any signs of progression.  I recommended that she return to clinic for CBE in 3 months, and she agrees.  Order entered.

## 2024-02-09 ENCOUNTER — Ambulatory Visit (HOSPITAL_BASED_OUTPATIENT_CLINIC_OR_DEPARTMENT_OTHER)

## 2024-02-16 ENCOUNTER — Other Ambulatory Visit: Payer: Self-pay

## 2024-02-16 ENCOUNTER — Ambulatory Visit

## 2024-02-17 ENCOUNTER — Ambulatory Visit: Attending: Urology

## 2024-02-17 ENCOUNTER — Telehealth (HOSPITAL_BASED_OUTPATIENT_CLINIC_OR_DEPARTMENT_OTHER): Payer: Self-pay | Admitting: Urology

## 2024-02-17 DIAGNOSIS — C641 Malignant neoplasm of right kidney, except renal pelvis: Secondary | ICD-10-CM

## 2024-02-17 NOTE — Telephone Encounter (Signed)
 02/17/2024    Images requested to be pushed to [facility] PACS via fax for the following:    Outside Facility/Location: [facility]  Fax: [phone]  DOS: 02/16/2024  Images requested: CT Abdomen Pelvis    Radiology records intake request order placed.

## 2024-02-18 ENCOUNTER — Other Ambulatory Visit (HOSPITAL_COMMUNITY): Payer: Self-pay

## 2024-02-18 NOTE — Telephone Encounter (Signed)
 Images are now in PACS, to be reviewed at 7/16 appt.

## 2024-03-02 ENCOUNTER — Ambulatory Visit: Admitting: Urology

## 2024-03-02 ENCOUNTER — Encounter (HOSPITAL_BASED_OUTPATIENT_CLINIC_OR_DEPARTMENT_OTHER): Payer: Self-pay

## 2024-03-02 DIAGNOSIS — Z85528 Personal history of other malignant neoplasm of kidney: Secondary | ICD-10-CM

## 2024-03-02 DIAGNOSIS — Z08 Encounter for follow-up examination after completed treatment for malignant neoplasm: Secondary | ICD-10-CM

## 2024-03-02 DIAGNOSIS — C641 Malignant neoplasm of right kidney, except renal pelvis: Secondary | ICD-10-CM

## 2024-03-02 NOTE — Progress Notes (Signed)
 Use of Ambient Listening:   Was ambient listening technology used during this visit and was verbal consent for recording obtained? No, not used. Not discussed this visit.      UROLOGY TELEHEALTH FOLLOW UP NOTE    ID/CC:    Right RCC    History of Present Illness:    Amber Fletcher is a 68 year old woman who returns to me for right robotic partial nephrectomy on 10/27/19 for a 2.6 cm pT1a clear cell RCC grade 2.  Labs from Jan 15, 2024 measured serum creatinine 0.71 mg/dL GFR > 60 mL/min per 8.26 m and CXR same date was negative.  February 16, 2024 CT at [place] negative but demonstrates a 2.5 cm left adrenal adenoma that is stable.     She is feeling better overall. Good appetite. Denies gross hematuria or flank pain.    Has BRCA2 and remains concerned. Was concerned about lesion she felt on breast incision. Underwent biopsy that was negative on Dec 31, 2023.        Review of Systems:  A 14 system review of system was filled out by the patient.  Positives include feeling well.  The rest of the review of system is negative and can be linked to in the chart dated today.    Past Medical Hx:    Patient Active Problem List   Diagnosis    History of breast cancer    Acquired absence of breast and absent nipple    Clear cell renal cell carcinoma, right (HCC)    Renal cell cancer, right (HCC)    S/P laparoscopic cholecystectomy    Mass of left chest wall    BRCA2 gene mutation positive in female       Past Surgical Hx:    Past Surgical History:   Procedure Laterality Date    CHOLECYSTECTOMY  10/27/2019    [place] with Dr. Teresa    PR LAPAROSCOPY SURG PARTIAL NEPHRECTOMY Right 10/27/2019    PR TOTAL ABDOMINAL HYSTERECT W/WO RMVL TUBE OVARY      BRCA 2 prophylaxis    PR UNLISTED PROCEDURE BREAST Bilateral     mastectomy       Social Hx:      reports that she has never smoked. She does not have any smokeless tobacco history on file. She reports current alcohol use..    Social History     Social History Narrative     12/2023: lives in [place] but in [place] with grandkids this week.  Daughter is followed in [facility] high risk clinic       Active Meds:    No outpatient medications have been marked as taking for the 03/02/24 encounter (Appointment) with Hollis Edin, MD.       OBJECTIVE:  Vital Signs:  There were no vitals taken for this visit.  Constitutional: No acute distress  No exam for telehealth visit.      Review of Medical Images, tracings or specimens:    Labs Reviewed today with the patient include  Orders Only on 01/15/2024   Component Date Value Ref Range Status    Sodium 01/15/2024 139  135 - 145 meq/L Final    Potassium 01/15/2024 4.1  3.6 - 5.2 meq/L Final    Chloride 01/15/2024 103  98 - 108 meq/L Final    Carbon Dioxide, Total 01/15/2024 30  22 - 32 meq/L Final    Anion Gap 01/15/2024 6  4 - 12 Final  Glucose 01/15/2024 111  62 - 125 mg/dL Final    Urea Nitrogen 01/15/2024 13  8 - 21 mg/dL Final    Creatinine 94/69/7974 0.71  0.38 - 1.02 mg/dL Final    Protein (Total) 01/15/2024 7.6  6.0 - 8.2 g/dL Final    Albumin 94/69/7974 4.4  3.5 - 5.2 g/dL Final    Bilirubin (Total) 01/15/2024 0.5  0.2 - 1.3 mg/dL Final    Calcium 94/69/7974 9.5  8.9 - 10.2 mg/dL Final    AST (GOT) 94/69/7974 18  9 - 38 U/L Final    Alkaline Phosphatase (Total) 01/15/2024 75  38 - 172 U/L Final    ALT (GPT) 01/15/2024 22  7 - 33 U/L Final    eGFR by CKD-EPI 2021 01/15/2024 >60  >59 mL/min/1.73_m2 Final       ASSESSMENT:  Amber Fletcher is a 68 year old woman with right RCC who underwent right robotic partial nephrectomy returns to me for right robotic partial nephrectomy on October 27, 2019 for a 2.6 cm pT1a clear cell RCC grade 2. NED and labs normal.     PLAN:    Follow next year with new CT scan, chest x-ray and labs.  Call sooner with any issues.   We discussed after 5 years of follow up, we can follow as needed.    Distant Site Telemedicine Encounter  I conducted this encounter via secure, live, face-to-face video conference with  the patient. I reviewed the risks and benefits of telemedicine as pertinent to this visit and the patient agreed to proceed.    Provider Location: On-site location (clinic, hospital, on-site office)  Patient Location: At home    Present with patient: Her husband     I spent a total of 20 minutes for the patient's care on the date of the service.

## 2024-03-02 NOTE — Patient Instructions (Addendum)
 Follow next year with new CT scan, chest x-ray and labs.  Call sooner with any issues.   After 5 years of follow up, we can consider to follow as needed.

## 2024-04-04 ENCOUNTER — Ambulatory Visit (HOSPITAL_BASED_OUTPATIENT_CLINIC_OR_DEPARTMENT_OTHER): Admitting: Physician Assistant

## 2024-04-12 ENCOUNTER — Ambulatory Visit: Attending: Physician Assistant | Admitting: Physician Assistant

## 2024-04-12 VITALS — BP 123/80 | HR 68 | Temp 97.7°F | Resp 16 | Wt 194.4 lb

## 2024-04-12 DIAGNOSIS — Z803 Family history of malignant neoplasm of breast: Secondary | ICD-10-CM

## 2024-04-12 DIAGNOSIS — Z1501 Genetic susceptibility to malignant neoplasm of breast: Secondary | ICD-10-CM | POA: Insufficient documentation

## 2024-04-12 DIAGNOSIS — Z1239 Encounter for other screening for malignant neoplasm of breast: Secondary | ICD-10-CM

## 2024-04-12 DIAGNOSIS — Z853 Personal history of malignant neoplasm of breast: Secondary | ICD-10-CM | POA: Insufficient documentation

## 2024-04-12 DIAGNOSIS — R222 Localized swelling, mass and lump, trunk: Secondary | ICD-10-CM | POA: Insufficient documentation

## 2024-04-12 DIAGNOSIS — Z08 Encounter for follow-up examination after completed treatment for malignant neoplasm: Secondary | ICD-10-CM | POA: Insufficient documentation

## 2024-04-12 DIAGNOSIS — Z1502 Genetic susceptibility to malignant neoplasm of ovary: Secondary | ICD-10-CM | POA: Insufficient documentation

## 2024-04-12 DIAGNOSIS — Z9013 Acquired absence of bilateral breasts and nipples: Secondary | ICD-10-CM | POA: Insufficient documentation

## 2024-04-12 DIAGNOSIS — Z1509 Genetic susceptibility to other malignant neoplasm: Secondary | ICD-10-CM | POA: Insufficient documentation

## 2024-04-12 NOTE — Progress Notes (Signed)
 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    PAST BREAST HISTORY:  Amber Fletcher was diagnosed with a screen detected left breast cancer in 2004.  She was treated with breast conservation and underwent a lumpectomy with sentinel lymph node biopsy, with staging T1b N0.  She received adjuvant radiation and 5 years of endocrine therapy.  All treatment completed through [facility].    She recalls being sent for genetic testing about a year following her diagnosis, and was found to have a pathogenic variant of BRCA2.    She elected to undergo risk-reducing mastectomy (skin-sparing) with DIEP reconstruction, performed by Drs. Calhoun/Said on 09/11/2010.  All tissue was benign.    Amber Fletcher was first seen by me in [facility] on 12/31/2023 for evaluation of a self appreciated left chest wall mass.  CBE was notable for a 2 cm x 2.5 cm firm superficial mass in the left chest wall at 8:00 close to the sternum, with associated skin change.  Punch biopsy that date showed fat necrosis and mixed inflammation with eosinophils, and no malignancy.  Out of concern for possible under sampling, I still recommended that Amber Fletcher proceed to diagnostic mammogram and ultrasound, completed on 5/30.  This showed a 19 x 19 mm oval, circumscribed mass with cystic space in the left chest wall at 9:00, correlating with the palpable finding and consistent with evolving fat necrosis, BI-RADS 2.  This is also seen on mammography.  I recommended that Amber Fletcher return in 3 months for CBE to ensure stability.    INTERVAL BREAST HISTORY:  Amber Fletcher is doing well.  She has not noted any changes on self  breast awareness.  She notes that the left medial chest wall mass is still present, but does not believe it is changed.  She denies any pain associated with this following the biopsy.    GYNECOLOGIC HISTORY:  G3P3.  Menarche at age 89.  First pregnancy at age 52.  She is post-menopausal.  LMP age 19 (s/p RR TVH-BSO).  OCP: none  HRT: none    PAST MEDICAL HISTORY:  Positive for BRCA2 pathogenic variant  Left breast cancer, 2004  Right clear-cell RCC, 2021.  Treated with R0 resection, followed by Dr. Glenora Palin with annual CT CAP.    Left adrenal adenoma, stable on 02/2024 CT  Osteoporosis  Hyperlipidemia  GERD    PAST SURGICAL HISTORY:  Left lumpectomy with sentinel lymph node biopsy, 2004  Laparoscopic-assisted vaginal hysterectomy with BSO, 2005  Bilateral skin sparing risk-reducing mastectomy with bilateral DIEP, 2012  Laparoscopic cholecystectomy with right partial nephrectomy, 10/2019  I&D of labial abscess, 2023    MEDICATIONS:    Current Outpatient Medications:     Ascorbic Acid (VITAMIN C OR), Take by mouth daily., Disp: , Rfl:     CALCIUM OR, Take by mouth daily., Disp: , Rfl:     Ergocalciferol (VITAMIN D OR), Take by mouth daily., Disp: , Rfl:     Multiple Vitamin (MULTIVITAMIN OR), Take by mouth daily., Disp: , Rfl:     omeprazole 40 MG DR capsule, Take 1 capsule (40 mg) by mouth daily on  an empty stomach., Disp: , Rfl:     rosuvastatin 10 MG tablet, Take 1 tablet (10 mg) by mouth every evening., Disp: , Rfl:     ALLERGIES:  Oxycodone    FAMILY HISTORY:  Her sister was diagnosed with breast cancer at age 20, and her niece (that sister's daughter) diagnosed at age 39.  Both women were found to be BRCA2 positive.      Amber Fletcher's maternal grandmother was diagnosed with breast cancer at age 40.    Amber Fletcher's two daughters are both BRCA2 positive, one of whom is followed at [facility].    Amber Fletcher's mother, who is 86 years old and has no personal history of cancer, is also BRCA2 positive.  Amber Fletcher had 2 maternal uncles who died  of prostate cancer.    SOCIAL HISTORY:  Lives in [place] with her spouse.  They spend the winters in [place] .  She has a total of 6 grandchildren, 3 of whom she is babysitting this week.  Never smoker.  Does not drink alcohol.  She is retired and previously worked for [employer] [place] .  Her exercise is limited and includes walking once or twice per week at 15-minute intervals.  ECOG of 0.    PHYSICAL EXAM:  BP 123/80   Pulse 68   Temp 36.5 C (Oral)   Resp 16   Wt 88.2 kg (194 lb 6.4 oz)   SpO2 95%   BMI 34.44 kg/m   General: Well-appearing adult female, no acute distress.  Breasts: Examined in the seated and supine positions.    Inspection: Breasts are surgically absent, with viable DIEP flaps in expected position.  Moderate asymmetry, with right flap much larger.  There is faint scar peripherally at 8:00 in the left chest wall, without skin color change.  Palpation: There is a 2 cm x 2 cm firm superficial irregular mass at 8:00 parasternal.  No other lumps or masses in the chest wall bilaterally.  Lymphatics: No head or neck, supra or infraclavicular, or axillary lymphadenopathy bilaterally.    The patient was asked if they would like to have a chaperone in the room for this exam.   The patient declined a chaperone.     ASSESSMENT AND PLAN:  Amber Fletcher is a 68 year old female with remote personal history of BRCA2 associated left breast cancer, treated with breast conservation prior to subsequent RRM with autologous reconstruction.  Today's CBE confirms stability of the area of fat necrosis in her left lower inner chest wall.  No evidence of malignancy.    I reassured Amber Fletcher that her CBE is consistent with a stable area of fat necrosis.  I explained that this may persist, or else it may evolve and reabsorb some over time.  I have encouraged her to maintain self breast awareness, and return to clinic for any changes on self-exam.  We agreed to have her return for repeat CBE to ensure  stability in 12/2024, when she returns from her winter in [place] .    It was a pleasure to meet with Amber Fletcher today.  I spent 20 minutes for the patient's care on the date of service.

## 8284-11-16 DEATH — deceased
